# Patient Record
Sex: Male | Born: 1945 | Race: Black or African American | Hispanic: No | Marital: Married | Smoking: Never smoker
Health system: Southern US, Community
[De-identification: ages and names within clinical notes are randomized; demographics above are authoritative.]

## PROBLEM LIST (undated history)

## (undated) DIAGNOSIS — F40240 Claustrophobia: Secondary | ICD-10-CM

## (undated) DIAGNOSIS — N400 Enlarged prostate without lower urinary tract symptoms: Secondary | ICD-10-CM

## (undated) DIAGNOSIS — R269 Unspecified abnormalities of gait and mobility: Secondary | ICD-10-CM

## (undated) DIAGNOSIS — R413 Other amnesia: Secondary | ICD-10-CM

## (undated) DIAGNOSIS — F32A Depression, unspecified: Secondary | ICD-10-CM

## (undated) DIAGNOSIS — R471 Dysarthria and anarthria: Secondary | ICD-10-CM

## (undated) DIAGNOSIS — R399 Unspecified symptoms and signs involving the genitourinary system: Secondary | ICD-10-CM

## (undated) DIAGNOSIS — N529 Male erectile dysfunction, unspecified: Secondary | ICD-10-CM

## (undated) DIAGNOSIS — I10 Essential (primary) hypertension: Secondary | ICD-10-CM

## (undated) DIAGNOSIS — L723 Sebaceous cyst: Secondary | ICD-10-CM

## (undated) DIAGNOSIS — H409 Unspecified glaucoma: Secondary | ICD-10-CM

## (undated) DIAGNOSIS — E782 Mixed hyperlipidemia: Secondary | ICD-10-CM

## (undated) DIAGNOSIS — R3129 Other microscopic hematuria: Secondary | ICD-10-CM

## (undated) DIAGNOSIS — G238 Other specified degenerative diseases of basal ganglia: Secondary | ICD-10-CM

## (undated) DIAGNOSIS — F329 Major depressive disorder, single episode, unspecified: Secondary | ICD-10-CM

## (undated) DIAGNOSIS — Z973 Presence of spectacles and contact lenses: Secondary | ICD-10-CM

## (undated) DIAGNOSIS — J309 Allergic rhinitis, unspecified: Secondary | ICD-10-CM

## (undated) HISTORY — DX: Unspecified abnormalities of gait and mobility: R26.9

## (undated) HISTORY — DX: Other amnesia: R41.3

## (undated) HISTORY — DX: Essential (primary) hypertension: I10

## (undated) HISTORY — DX: Allergic rhinitis, unspecified: J30.9

## (undated) HISTORY — DX: Mixed hyperlipidemia: E78.2

## (undated) HISTORY — PX: NO PAST SURGERIES: SHX2092

## (undated) HISTORY — DX: Other specified degenerative diseases of basal ganglia: G23.8

## (undated) HISTORY — PX: OTHER SURGICAL HISTORY: SHX169

---

## 2000-06-29 ENCOUNTER — Ambulatory Visit (HOSPITAL_COMMUNITY): Admission: RE | Admit: 2000-06-29 | Discharge: 2000-06-29 | Payer: Self-pay | Admitting: *Deleted

## 2004-10-18 ENCOUNTER — Inpatient Hospital Stay (HOSPITAL_COMMUNITY): Admission: EM | Admit: 2004-10-18 | Discharge: 2004-10-20 | Payer: Self-pay | Admitting: Emergency Medicine

## 2004-10-19 ENCOUNTER — Ambulatory Visit: Payer: Self-pay | Admitting: Internal Medicine

## 2004-10-28 ENCOUNTER — Ambulatory Visit: Payer: Self-pay | Admitting: Cardiology

## 2005-08-04 ENCOUNTER — Ambulatory Visit (HOSPITAL_COMMUNITY): Admission: RE | Admit: 2005-08-04 | Discharge: 2005-08-04 | Payer: Self-pay | Admitting: *Deleted

## 2011-03-22 DIAGNOSIS — E7800 Pure hypercholesterolemia, unspecified: Secondary | ICD-10-CM | POA: Diagnosis not present

## 2011-03-22 DIAGNOSIS — E119 Type 2 diabetes mellitus without complications: Secondary | ICD-10-CM | POA: Diagnosis not present

## 2011-03-22 DIAGNOSIS — I10 Essential (primary) hypertension: Secondary | ICD-10-CM | POA: Diagnosis not present

## 2011-03-29 DIAGNOSIS — B000 Eczema herpeticum: Secondary | ICD-10-CM | POA: Diagnosis not present

## 2011-03-29 DIAGNOSIS — R269 Unspecified abnormalities of gait and mobility: Secondary | ICD-10-CM | POA: Diagnosis not present

## 2011-04-12 DIAGNOSIS — R269 Unspecified abnormalities of gait and mobility: Secondary | ICD-10-CM | POA: Diagnosis not present

## 2011-04-12 DIAGNOSIS — M629 Disorder of muscle, unspecified: Secondary | ICD-10-CM | POA: Diagnosis not present

## 2011-04-14 DIAGNOSIS — R269 Unspecified abnormalities of gait and mobility: Secondary | ICD-10-CM | POA: Diagnosis not present

## 2011-04-14 DIAGNOSIS — M629 Disorder of muscle, unspecified: Secondary | ICD-10-CM | POA: Diagnosis not present

## 2011-04-19 DIAGNOSIS — M629 Disorder of muscle, unspecified: Secondary | ICD-10-CM | POA: Diagnosis not present

## 2011-04-19 DIAGNOSIS — R269 Unspecified abnormalities of gait and mobility: Secondary | ICD-10-CM | POA: Diagnosis not present

## 2011-04-21 DIAGNOSIS — M629 Disorder of muscle, unspecified: Secondary | ICD-10-CM | POA: Diagnosis not present

## 2011-04-21 DIAGNOSIS — R269 Unspecified abnormalities of gait and mobility: Secondary | ICD-10-CM | POA: Diagnosis not present

## 2011-08-08 DIAGNOSIS — I10 Essential (primary) hypertension: Secondary | ICD-10-CM | POA: Diagnosis not present

## 2011-08-08 DIAGNOSIS — E782 Mixed hyperlipidemia: Secondary | ICD-10-CM | POA: Diagnosis not present

## 2011-08-08 DIAGNOSIS — R7309 Other abnormal glucose: Secondary | ICD-10-CM | POA: Diagnosis not present

## 2011-12-08 DIAGNOSIS — E782 Mixed hyperlipidemia: Secondary | ICD-10-CM | POA: Diagnosis not present

## 2011-12-08 DIAGNOSIS — R7309 Other abnormal glucose: Secondary | ICD-10-CM | POA: Diagnosis not present

## 2011-12-08 DIAGNOSIS — I10 Essential (primary) hypertension: Secondary | ICD-10-CM | POA: Diagnosis not present

## 2012-03-12 DIAGNOSIS — I10 Essential (primary) hypertension: Secondary | ICD-10-CM | POA: Diagnosis not present

## 2012-03-12 DIAGNOSIS — R7309 Other abnormal glucose: Secondary | ICD-10-CM | POA: Diagnosis not present

## 2012-03-12 DIAGNOSIS — E782 Mixed hyperlipidemia: Secondary | ICD-10-CM | POA: Diagnosis not present

## 2012-06-18 DIAGNOSIS — E782 Mixed hyperlipidemia: Secondary | ICD-10-CM | POA: Diagnosis not present

## 2012-06-18 DIAGNOSIS — I10 Essential (primary) hypertension: Secondary | ICD-10-CM | POA: Diagnosis not present

## 2012-06-18 DIAGNOSIS — R7309 Other abnormal glucose: Secondary | ICD-10-CM | POA: Diagnosis not present

## 2012-10-18 DIAGNOSIS — G238 Other specified degenerative diseases of basal ganglia: Secondary | ICD-10-CM | POA: Diagnosis not present

## 2012-10-18 DIAGNOSIS — R269 Unspecified abnormalities of gait and mobility: Secondary | ICD-10-CM | POA: Diagnosis not present

## 2012-10-18 DIAGNOSIS — I10 Essential (primary) hypertension: Secondary | ICD-10-CM | POA: Diagnosis not present

## 2012-10-22 DIAGNOSIS — I10 Essential (primary) hypertension: Secondary | ICD-10-CM | POA: Diagnosis not present

## 2012-10-22 DIAGNOSIS — R7309 Other abnormal glucose: Secondary | ICD-10-CM | POA: Diagnosis not present

## 2012-10-22 DIAGNOSIS — E782 Mixed hyperlipidemia: Secondary | ICD-10-CM | POA: Diagnosis not present

## 2012-11-01 DIAGNOSIS — M6281 Muscle weakness (generalized): Secondary | ICD-10-CM | POA: Diagnosis not present

## 2013-01-24 DIAGNOSIS — R7309 Other abnormal glucose: Secondary | ICD-10-CM | POA: Diagnosis not present

## 2013-01-24 DIAGNOSIS — E782 Mixed hyperlipidemia: Secondary | ICD-10-CM | POA: Diagnosis not present

## 2013-01-24 DIAGNOSIS — I10 Essential (primary) hypertension: Secondary | ICD-10-CM | POA: Diagnosis not present

## 2013-04-16 DIAGNOSIS — R059 Cough, unspecified: Secondary | ICD-10-CM | POA: Diagnosis not present

## 2013-04-16 DIAGNOSIS — R05 Cough: Secondary | ICD-10-CM | POA: Diagnosis not present

## 2013-04-16 DIAGNOSIS — R252 Cramp and spasm: Secondary | ICD-10-CM | POA: Diagnosis not present

## 2013-04-16 DIAGNOSIS — R0609 Other forms of dyspnea: Secondary | ICD-10-CM | POA: Diagnosis not present

## 2013-06-20 DIAGNOSIS — R7309 Other abnormal glucose: Secondary | ICD-10-CM | POA: Diagnosis not present

## 2013-06-20 DIAGNOSIS — E782 Mixed hyperlipidemia: Secondary | ICD-10-CM | POA: Diagnosis not present

## 2013-06-20 DIAGNOSIS — I10 Essential (primary) hypertension: Secondary | ICD-10-CM | POA: Diagnosis not present

## 2013-07-04 ENCOUNTER — Ambulatory Visit: Payer: Medicare Other | Admitting: Neurology

## 2013-07-05 ENCOUNTER — Encounter: Payer: Self-pay | Admitting: Neurology

## 2013-07-08 ENCOUNTER — Ambulatory Visit (INDEPENDENT_AMBULATORY_CARE_PROVIDER_SITE_OTHER): Payer: BC Managed Care – PPO | Admitting: Neurology

## 2013-07-08 ENCOUNTER — Encounter: Payer: Self-pay | Admitting: Neurology

## 2013-07-08 VITALS — BP 151/102 | HR 83 | Temp 97.0°F | Ht 70.0 in | Wt 200.0 lb

## 2013-07-08 DIAGNOSIS — F411 Generalized anxiety disorder: Secondary | ICD-10-CM

## 2013-07-08 DIAGNOSIS — G238 Other specified degenerative diseases of basal ganglia: Secondary | ICD-10-CM | POA: Diagnosis not present

## 2013-07-08 DIAGNOSIS — G903 Multi-system degeneration of the autonomic nervous system: Secondary | ICD-10-CM | POA: Insufficient documentation

## 2013-07-08 DIAGNOSIS — F329 Major depressive disorder, single episode, unspecified: Secondary | ICD-10-CM

## 2013-07-08 DIAGNOSIS — R1310 Dysphagia, unspecified: Secondary | ICD-10-CM

## 2013-07-08 DIAGNOSIS — R413 Other amnesia: Secondary | ICD-10-CM | POA: Diagnosis not present

## 2013-07-08 DIAGNOSIS — F32A Depression, unspecified: Secondary | ICD-10-CM

## 2013-07-08 DIAGNOSIS — F3289 Other specified depressive episodes: Secondary | ICD-10-CM | POA: Diagnosis not present

## 2013-07-08 DIAGNOSIS — R471 Dysarthria and anarthria: Secondary | ICD-10-CM

## 2013-07-08 NOTE — Patient Instructions (Addendum)
Please stop driving.   Please use your walker at all times for risk of falling.   Please talk to Dr. Renne CriglerPharr about your depression.   We will do a MRI brain and a swallow study. We will call you with the test results.

## 2013-07-08 NOTE — Progress Notes (Signed)
Subjective:    Patient ID: Donald Stokes is a 68 y.o. male.  HPI    Huston FoleySaima Atlee Villers, MD, PhD [facility] [address], [address] [address] [place], [place] [id number]  Dear Dr. Renne CriglerPharr,   I saw your patient, Donald Stokes, upon your kind request in my neurologic clinic today for initial consultation of his memory loss. The patient is accompanied by his wife today. As you know, Mr. 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 He was followed in our office , who has had some new memory loss. He reports forgetfulness for short term memory. His wife feels, that this referral is primarily for his underlying neurodegenerative condition. He has had some swallowing issues, but not on a daily basis. He walks with a rolling walker when outside, and has a scooter for longer distances, but inside the house he uses a cane, but mostly holds onto things. He was seen by Dr. Noreene FilbertSchmidt in 2003 and diagnosed with OPCA and saw Dr. 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 He had a brain MRI in 03/1999, which apparently showed prominent cerebellar and brainstem atrophy. He has not had a swallow study. He does not have a FHx of cerebellar ataxia. His son has MS.  He has fallen, but not in the last 1 1/2 months and thankfully has not hurt himself. He tends to trip and catches himself. He has sleep issues, including insomnia at night and EDS. He wakes with up with SOB, but does not have apneic pauses. In fact, [place], which per wife did not show any significant sleep disorder. He has control of his B/B function. He reports some depression and anxiety, but denies SI/HI, and has no hallucinations, no delusions, and no evidence of RBD. He sleeps  poorly and is restless. He sleeps best between 5 AM and 8 AM. He denies RLS Sx and there is no report of PLMs. He still drives.  His Past Medical History Is Significant For: Past Medical History  Diagnosis Date  . Memory loss   . Mixed hyperlipidemia   . Other abnormal glucose   . Other degenerative diseases of the basal ganglia   . Abnormality of gait   . Unspecified essential hypertension   . Other drug allergy   . Hypertrophy of prostate without urinary obstruction and other lower urinary tract symptoms (LUTS)   . Allergic rhinitis, cause unspecified   . Overweight   . 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  His Family History Is Significant For: Family History  Problem Relation Age of Onset  . Cancer Mother   . Heart failure Father   . Multiple sclerosis Son   . Stroke Maternal Grandmother   . Heart failure Maternal Uncle     His Social History Is Significant For: History   Social History  . Marital Status: Married    Spouse Name: N/A    Number of Children: N/A  . Years of Education: N/A   Social History Main Topics  . Smoking status: Never Smoker   . Smokeless tobacco: None  . Alcohol Use: No  . Drug Use: No  . Sexual Activity: None   Other Topics Concern  .  None   Social History Narrative  . None    His Allergies Are:  Allergies  Allergen Reactions  . Captopril     sexual impairment   . Corgard [Nadolol]     sexual impairment   . Cozaar [Losartan Potassium]     erectile disfunction  . Inderal La [Propranolol Hcl]     sexual impairment  . Minipress [Prazosin]     sexual impairment  . Reserpine     sexual impairment  . Tenormin [Atenolol]     sexual impairment  . Welchol [Colesevelam Hcl]     constipation  . Zocor [Simvastatin]     increased CK  :   His Current Medications Are:  Outpatient Encounter Prescriptions as of 07/08/2013  Medication Sig  .  amLODipine (NORVASC) 10 MG tablet Take 10 mg by mouth daily.  Marland Kitchen aspirin 81 MG tablet Take 81 mg by mouth daily.  Marland Kitchen ezetimibe (ZETIA) 10 MG tablet Take 10 mg by mouth daily.  . fenofibrate (TRICOR) 145 MG tablet Take 145 mg by mouth daily.  Marland Kitchen losartan-hydrochlorothiazide (HYZAAR) 50-12.5 MG per tablet Take 1 tablet by mouth daily.  . tamsulosin (FLOMAX) 0.4 MG CAPS capsule Take 0.4 mg by mouth daily.  : Review of Systems:  Out of a complete 14 point review of systems, all are reviewed and negative with the exception of these symptoms as listed below:   Review of Systems  Constitutional: Positive for activity change (disinterest).  HENT: Positive for hearing loss.   Eyes: Negative.   Respiratory: Positive for shortness of breath.   Cardiovascular: Negative.   Gastrointestinal:       Incontinence  Endocrine: Positive for polydipsia.  Genitourinary:       Impotence  Musculoskeletal:       Cramps  Skin: Negative.   Allergic/Immunologic: Positive for environmental allergies.  Neurological: Positive for speech difficulty and weakness.       Memory loss  Psychiatric/Behavioral: Positive for confusion, sleep disturbance (insomnia) and dysphoric mood. The patient is nervous/anxious.     Objective:  Neurologic Exam  Physical Exam Physical Examination:   Filed Vitals:   07/08/13 1008  BP: 151/102  Pulse: 83  Temp: 97 F (36.1 C)    General Examination: The patient is a very pleasant 68 y.o. male in no acute distress.  HEENT: Normocephalic, atraumatic, pupils are equal, round and reactive to light and accommodation. Funduscopic exam is normal with sharp disc margins noted. Extraocular tracking shows moderate saccadic breakdown without nystagmus noted. There is limitation to upper gaze. There is mild decrease in eye blink rate. Hearing is intact. Tympanic membranes are obscured by cerumen bilaterally. Face is symmetric with mild facial masking and normal facial sensation. There is no  lip, neck or jaw tremor. Neck is mildly rigid with intact passive ROM. There are no carotid bruits on auscultation. Oropharynx exam reveals moderate mouth dryness. No significant airway crowding is noted. Mallampati is class III. Tongue protrudes centrally and palate elevates symmetrically.   There is no drooling.   Chest: is clear to auscultation without wheezing, rhonchi or crackles noted.  Heart: sounds are regular and normal without murmurs, rubs or gallops noted.   Abdomen: is soft, non-tender and non-distended with normal bowel sounds appreciated on auscultation.  Extremities: There is no pitting edema in the distal lower extremities bilaterally. Pedal pulses are intact.  Skin: is warm and dry with no trophic changes noted. Age-related changes are noted on the skin.   Musculoskeletal: exam reveals  no obvious joint deformities, tenderness, joint swelling or erythema.  Neurologically:  Mental status: The patient is awake and alert, paying good  attention. He is able to partially provide the history. His wife provides details. He is oriented to: person, place, time/date, situation, day of week, month of year and year. His memory, attention, language and knowledge are mildly impaired. There is no aphasia, agnosia, apraxia or anomia. There is a mild degree of bradyphrenia. Speech is mildly hypophonic with severe dysarthria noted and mild to moderate degree of scanning. Mood is congruent and affect is blunted.  His MMSE score is 26/30. CDT is 3/4. AFT (Animal Fluency Test) score is 9.   Cranial nerves are as described above under HEENT exam. In addition, shoulder shrug is normal with equal shoulder height noted.  Motor exam: Normal bulk, and strength for age is noted. There are no dyskinesias noted.  Tone is not rigid with absence of cogwheeling in the extremities. There is overall moderate bradykinesia. There is no drift, but he has evidence of rebound bilaterally.  There is no tremor.    Romberg is positive.  Reflexes are 1+ in the upper extremities and trace in the lower extremities. Toes are downgoing bilaterally.  Fine motor skills exam: Finger taps are moderately impaired on the right and moderately impaired on the left. Hand movements are moderately impaired on the right and moderately impaired on the left. RAP (rapid alternating patting) is moderately impaired on the right and moderately impaired on the left. Foot taps are moderately impaired on the right and moderately impaired on the left. Foot agility (in the form of heel stomping) is moderately impaired on the right and moderately impaired on the left.    Cerebellar testing shows moderate dysmetria a mild intention tremor on finger to nose testing. Heel to shin is moderately impaired bilaterally. There is very mild truncal ataxia.   Sensory exam is intact to light touch, pinprick, vibration, temperature sense in the upper and lower extremities.   Gait, station and balance: He stands up from the seated position with mild difficulty and does need to push up with His hands. He needs no assistance. No veering to one side is noted. He is not noted to lean to the side. Posture is mildly stooped for age. Stance is wide-based. He walks with his rolling walker with an ataxic, wide-base gait and difficulty with turns. His upper body turns faster than his lower body and he therefore turns the walker faster than his legs or feet can catch up with. Tandem walk is not possible. Balance is moderately impaired. He is not able to do a toe or heel stance.     Assessment and Plan:   In summary, Donald Stokes is a very pleasant 68 y.o.-year old male with a history of a neurodegenerative condition, most likely in keeping with MSA-C, per prior Hx and exam. He has evidence of mild depression and anxiety and is encouraged to discuss treatment of depression with you. He has evidence of mild memory impairment, most likely in the realm of mild  cognitive impairment. He reports swallowing issues. He had a sleep study which apparently did not show any evidence of RBD or sleep disordered breathing. I had a long discussion with the patient and his wife today and would like to proceed with a brain MRI without contrast, a swallow study and referral to physical therapy for gait evaluation and evaluation of his walker. I have also placed a referral for home health occupational  therapy to see if he has any equipment needs were adjustments to be done at home. He is advised to no longer drive. He is advised to use his walker at all times. I explained to the patient and his wife that his condition is invariably progressive and it is hard to say how quickly it will progress. Most likely he will see worsening of his balance and his walking and speech and swallowing difficulties. His memory make it worse as well. At this juncture, I did not suggest any new medications from my end of things and we should be able to call him with his test results. If there are any equipment needs I would be happy to write prescriptions for those.  I answered all their questions today and the patient and his wife were in agreement with the above outlined plan. I would like to see the patient back in 6 months, sooner if the need arises and encouraged them to call with any interim questions, concerns, problems or updates.   Thank you very much for allowing me to participate in the care of this nice patient. If I can be of any further assistance to you please do not hesitate to call me at [phone].  Sincerely,   Huston Foley, MD, PhD

## 2013-07-10 DIAGNOSIS — F411 Generalized anxiety disorder: Secondary | ICD-10-CM | POA: Diagnosis not present

## 2013-07-10 DIAGNOSIS — Z9181 History of falling: Secondary | ICD-10-CM | POA: Diagnosis not present

## 2013-07-10 DIAGNOSIS — F329 Major depressive disorder, single episode, unspecified: Secondary | ICD-10-CM | POA: Diagnosis not present

## 2013-07-10 DIAGNOSIS — I10 Essential (primary) hypertension: Secondary | ICD-10-CM | POA: Diagnosis not present

## 2013-07-10 DIAGNOSIS — G259 Extrapyramidal and movement disorder, unspecified: Secondary | ICD-10-CM | POA: Diagnosis not present

## 2013-07-10 DIAGNOSIS — R279 Unspecified lack of coordination: Secondary | ICD-10-CM | POA: Diagnosis not present

## 2013-07-10 DIAGNOSIS — F3289 Other specified depressive episodes: Secondary | ICD-10-CM | POA: Diagnosis not present

## 2013-07-15 DIAGNOSIS — F329 Major depressive disorder, single episode, unspecified: Secondary | ICD-10-CM | POA: Diagnosis not present

## 2013-07-15 DIAGNOSIS — F3289 Other specified depressive episodes: Secondary | ICD-10-CM | POA: Diagnosis not present

## 2013-07-15 DIAGNOSIS — R279 Unspecified lack of coordination: Secondary | ICD-10-CM | POA: Diagnosis not present

## 2013-07-15 DIAGNOSIS — I10 Essential (primary) hypertension: Secondary | ICD-10-CM | POA: Diagnosis not present

## 2013-07-15 DIAGNOSIS — Z9181 History of falling: Secondary | ICD-10-CM | POA: Diagnosis not present

## 2013-07-15 DIAGNOSIS — F411 Generalized anxiety disorder: Secondary | ICD-10-CM | POA: Diagnosis not present

## 2013-07-15 DIAGNOSIS — G259 Extrapyramidal and movement disorder, unspecified: Secondary | ICD-10-CM | POA: Diagnosis not present

## 2013-07-17 DIAGNOSIS — F3289 Other specified depressive episodes: Secondary | ICD-10-CM | POA: Diagnosis not present

## 2013-07-17 DIAGNOSIS — R279 Unspecified lack of coordination: Secondary | ICD-10-CM | POA: Diagnosis not present

## 2013-07-17 DIAGNOSIS — F329 Major depressive disorder, single episode, unspecified: Secondary | ICD-10-CM | POA: Diagnosis not present

## 2013-07-17 DIAGNOSIS — Z9181 History of falling: Secondary | ICD-10-CM | POA: Diagnosis not present

## 2013-07-17 DIAGNOSIS — G259 Extrapyramidal and movement disorder, unspecified: Secondary | ICD-10-CM | POA: Diagnosis not present

## 2013-07-17 DIAGNOSIS — F411 Generalized anxiety disorder: Secondary | ICD-10-CM | POA: Diagnosis not present

## 2013-07-17 DIAGNOSIS — I10 Essential (primary) hypertension: Secondary | ICD-10-CM | POA: Diagnosis not present

## 2013-07-18 DIAGNOSIS — I10 Essential (primary) hypertension: Secondary | ICD-10-CM | POA: Diagnosis not present

## 2013-07-18 DIAGNOSIS — Z9181 History of falling: Secondary | ICD-10-CM | POA: Diagnosis not present

## 2013-07-18 DIAGNOSIS — F411 Generalized anxiety disorder: Secondary | ICD-10-CM | POA: Diagnosis not present

## 2013-07-18 DIAGNOSIS — F3289 Other specified depressive episodes: Secondary | ICD-10-CM | POA: Diagnosis not present

## 2013-07-18 DIAGNOSIS — F329 Major depressive disorder, single episode, unspecified: Secondary | ICD-10-CM | POA: Diagnosis not present

## 2013-07-18 DIAGNOSIS — R279 Unspecified lack of coordination: Secondary | ICD-10-CM | POA: Diagnosis not present

## 2013-07-18 DIAGNOSIS — G259 Extrapyramidal and movement disorder, unspecified: Secondary | ICD-10-CM | POA: Diagnosis not present

## 2013-07-19 DIAGNOSIS — G259 Extrapyramidal and movement disorder, unspecified: Secondary | ICD-10-CM | POA: Diagnosis not present

## 2013-07-19 DIAGNOSIS — Z9181 History of falling: Secondary | ICD-10-CM | POA: Diagnosis not present

## 2013-07-19 DIAGNOSIS — F411 Generalized anxiety disorder: Secondary | ICD-10-CM | POA: Diagnosis not present

## 2013-07-19 DIAGNOSIS — F3289 Other specified depressive episodes: Secondary | ICD-10-CM | POA: Diagnosis not present

## 2013-07-19 DIAGNOSIS — I10 Essential (primary) hypertension: Secondary | ICD-10-CM | POA: Diagnosis not present

## 2013-07-19 DIAGNOSIS — F329 Major depressive disorder, single episode, unspecified: Secondary | ICD-10-CM | POA: Diagnosis not present

## 2013-07-19 DIAGNOSIS — R279 Unspecified lack of coordination: Secondary | ICD-10-CM | POA: Diagnosis not present

## 2013-07-22 DIAGNOSIS — G259 Extrapyramidal and movement disorder, unspecified: Secondary | ICD-10-CM | POA: Diagnosis not present

## 2013-07-22 DIAGNOSIS — F3289 Other specified depressive episodes: Secondary | ICD-10-CM | POA: Diagnosis not present

## 2013-07-22 DIAGNOSIS — I10 Essential (primary) hypertension: Secondary | ICD-10-CM | POA: Diagnosis not present

## 2013-07-22 DIAGNOSIS — Z9181 History of falling: Secondary | ICD-10-CM | POA: Diagnosis not present

## 2013-07-22 DIAGNOSIS — R279 Unspecified lack of coordination: Secondary | ICD-10-CM | POA: Diagnosis not present

## 2013-07-22 DIAGNOSIS — F329 Major depressive disorder, single episode, unspecified: Secondary | ICD-10-CM | POA: Diagnosis not present

## 2013-07-22 DIAGNOSIS — F411 Generalized anxiety disorder: Secondary | ICD-10-CM | POA: Diagnosis not present

## 2013-07-24 DIAGNOSIS — F329 Major depressive disorder, single episode, unspecified: Secondary | ICD-10-CM | POA: Diagnosis not present

## 2013-07-24 DIAGNOSIS — F3289 Other specified depressive episodes: Secondary | ICD-10-CM | POA: Diagnosis not present

## 2013-07-24 DIAGNOSIS — F411 Generalized anxiety disorder: Secondary | ICD-10-CM | POA: Diagnosis not present

## 2013-07-24 DIAGNOSIS — G259 Extrapyramidal and movement disorder, unspecified: Secondary | ICD-10-CM | POA: Diagnosis not present

## 2013-07-24 DIAGNOSIS — Z9181 History of falling: Secondary | ICD-10-CM | POA: Diagnosis not present

## 2013-07-24 DIAGNOSIS — I10 Essential (primary) hypertension: Secondary | ICD-10-CM | POA: Diagnosis not present

## 2013-07-24 DIAGNOSIS — R279 Unspecified lack of coordination: Secondary | ICD-10-CM | POA: Diagnosis not present

## 2013-07-25 DIAGNOSIS — R279 Unspecified lack of coordination: Secondary | ICD-10-CM | POA: Diagnosis not present

## 2013-07-25 DIAGNOSIS — I10 Essential (primary) hypertension: Secondary | ICD-10-CM | POA: Diagnosis not present

## 2013-07-25 DIAGNOSIS — Z9181 History of falling: Secondary | ICD-10-CM | POA: Diagnosis not present

## 2013-07-25 DIAGNOSIS — F411 Generalized anxiety disorder: Secondary | ICD-10-CM | POA: Diagnosis not present

## 2013-07-25 DIAGNOSIS — G259 Extrapyramidal and movement disorder, unspecified: Secondary | ICD-10-CM | POA: Diagnosis not present

## 2013-07-25 DIAGNOSIS — F329 Major depressive disorder, single episode, unspecified: Secondary | ICD-10-CM | POA: Diagnosis not present

## 2013-07-25 DIAGNOSIS — F3289 Other specified depressive episodes: Secondary | ICD-10-CM | POA: Diagnosis not present

## 2013-07-26 ENCOUNTER — Ambulatory Visit (HOSPITAL_COMMUNITY)
Admission: RE | Admit: 2013-07-26 | Discharge: 2013-07-26 | Disposition: A | Discharge status: Home / Self Care | Payer: BC Managed Care – PPO | Source: Ambulatory Visit | Attending: Neurology | Admitting: Neurology

## 2013-07-26 DIAGNOSIS — R413 Other amnesia: Secondary | ICD-10-CM | POA: Diagnosis not present

## 2013-07-26 DIAGNOSIS — F411 Generalized anxiety disorder: Secondary | ICD-10-CM | POA: Diagnosis not present

## 2013-07-26 DIAGNOSIS — R1310 Dysphagia, unspecified: Secondary | ICD-10-CM | POA: Insufficient documentation

## 2013-07-26 DIAGNOSIS — F32A Depression, unspecified: Secondary | ICD-10-CM

## 2013-07-26 DIAGNOSIS — R471 Dysarthria and anarthria: Secondary | ICD-10-CM

## 2013-07-26 DIAGNOSIS — N400 Enlarged prostate without lower urinary tract symptoms: Secondary | ICD-10-CM | POA: Insufficient documentation

## 2013-07-26 DIAGNOSIS — G238 Other specified degenerative diseases of basal ganglia: Secondary | ICD-10-CM | POA: Diagnosis not present

## 2013-07-26 DIAGNOSIS — F329 Major depressive disorder, single episode, unspecified: Secondary | ICD-10-CM | POA: Diagnosis not present

## 2013-07-26 DIAGNOSIS — F3289 Other specified depressive episodes: Secondary | ICD-10-CM | POA: Diagnosis not present

## 2013-07-26 NOTE — Procedures (Signed)
Objective Swallowing Evaluation: Modified Barium Swallowing Study  Patient Details  Name: Donald Stokes MRN: [id number] Date of Birth: [birth year]  Today's Date: 07/26/2013 Time: [id number] SLP Time Calculation (min): 30 min  Past Medical History:  Past Medical History  Diagnosis Date  . Memory loss   . Mixed hyperlipidemia   . Other abnormal glucose   . Other degenerative diseases of the basal ganglia   . Abnormality of gait   . Unspecified essential hypertension   . Other drug allergy   . Hypertrophy of prostate without urinary obstruction and other lower urinary tract symptoms (LUTS)   . Allergic rhinitis, cause unspecified   . Overweight   . 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 HPI:  68 yo male referred by Dr Frances FurbishAthar for MBS due to pt report of coughing/choking with food and even on secretions.  Pt PMH + for memory loss, HLD, degenerative dx of basal ganglia, gait abnormality, allergic rhinitis, HTN, multiple system atrophy C.  Pt reports taking medications with water with good tolerance.  He coughs more on food than drinks x1 year and more often at lunch/dinner.  Pt admits to coughing when lying down in his bed at night but not during the day when he sleeps in the recliner.  He has not lost weight nor had pulmonary infections.       Assessment / Plan / Recommendation Clinical Impression  Dysphagia Diagnosis: Within Functional Limits  Clinical impression: [facility] with timely oral transiting and pharyngeal swallow and adequate strength without significant stasis.  Pt observed with thin, nectar, cracker, pudding and barium tablet.  Trace vallecular residuals with thin noted that cleared with cued dry swallow.  Pt did not cough/choke during MBS procedure.     Advised pt and spouse to general aspiration precautions given his report of choking on food 2-3 times each week.  Providing precautions/compensations in writing  given pt's medical diagnosis (MSA).    Pt reports frequent coughing when lying down at night  - but not when resting in recliner at night.  Advised him to speak to his Md re: this issue.   Recommend pt make concerted effort to swallow his saliva as question if pt with mild secretion management difficulties (secretion stasis at test initiation).      Treatment Recommendation  No treatment recommended at this time    Diet Recommendation Regular;Thin liquid   Liquid Administration via: Cup;Straw Medication Administration: Whole meds with liquid Supervision: Patient able to self feed Compensations: Slow rate;Small sips/bites Postural Changes and/or Swallow Maneuvers: Seated upright 90 degrees;Upright 30-60 min after meal      General Date of Onset: 07/26/13 HPI: 68 yo male referred by Dr Frances FurbishAthar for MBS due to pt report of coughing/choking with food and even on secretions.  Pt PMH + for memory loss, HLD, degenerative dx of basal ganglia, gait abnormality, allergic rhinitis, HTN, multiple system atrophy C.  Pt reports taking medications with water with good tolerance.  He coughs more on food than drinks x1 year and more often at lunch/dinner.  Pt admits to coughing when lying down in his bed at night but not during the day when he sleeps in the recliner.  He has not lost weight nor had pulmonary infections.   Type of Study: Modified Barium Swallowing Study Reason for Referral: Objectively evaluate swallowing function Diet Prior to this Study: Regular;Thin liquids Temperature Spikes Noted: No Respiratory Status: Room air Behavior/Cognition:  Alert;Cooperative;Pleasant mood Oral Cavity - Dentition: Adequate natural dentition Oral Motor / Sensory Function:  (facial ? mild asymmetry, equal sensation, tongue midline but weak, standing secretions in mouth, masked facies, ? slight twitching of left upper lip) Self-Feeding Abilities: Able to feed self Patient Positioning: Upright in chair Baseline Vocal  Quality: Clear Volitional Cough: Strong Volitional Swallow: Able to elicit Anatomy: Within functional limits Pharyngeal Secretions: Standing secretions in (comment) (oral cavity cleared)    Reason for Referral Objectively evaluate swallowing function   Oral Phase Oral Preparation/Oral Phase Oral Phase: WFL   Pharyngeal Phase Pharyngeal Phase Pharyngeal Phase: Within functional limits  Cervical Esophageal Phase    GO    Cervical Esophageal Phase Cervical Esophageal Phase: [facility]    Functional Assessment Tool Used: mbs. clinical impression Functional Limitations: Swallowing Swallow Current Status (O1561): At least 1 percent but less than 20 percent impaired, limited or restricted Swallow Goal Status [phone]): At least 1 percent but less than 20 percent impaired, limited or restricted Swallow Discharge Status [phone]): At least 1 percent but less than 20 percent impaired, limited or restricted    Donavan Burnet, MS [facility] SLP [phone]

## 2013-07-29 DIAGNOSIS — F329 Major depressive disorder, single episode, unspecified: Secondary | ICD-10-CM | POA: Diagnosis not present

## 2013-07-29 DIAGNOSIS — G259 Extrapyramidal and movement disorder, unspecified: Secondary | ICD-10-CM | POA: Diagnosis not present

## 2013-07-29 DIAGNOSIS — Z9181 History of falling: Secondary | ICD-10-CM | POA: Diagnosis not present

## 2013-07-29 DIAGNOSIS — F3289 Other specified depressive episodes: Secondary | ICD-10-CM | POA: Diagnosis not present

## 2013-07-29 DIAGNOSIS — I10 Essential (primary) hypertension: Secondary | ICD-10-CM | POA: Diagnosis not present

## 2013-07-29 DIAGNOSIS — F411 Generalized anxiety disorder: Secondary | ICD-10-CM | POA: Diagnosis not present

## 2013-07-29 DIAGNOSIS — R279 Unspecified lack of coordination: Secondary | ICD-10-CM | POA: Diagnosis not present

## 2013-07-30 ENCOUNTER — Ambulatory Visit
Admission: RE | Admit: 2013-07-30 | Discharge: 2013-07-30 | Disposition: A | Discharge status: Home / Self Care | Payer: BC Managed Care – PPO | Source: Ambulatory Visit | Attending: Neurology | Admitting: Neurology

## 2013-07-30 DIAGNOSIS — F329 Major depressive disorder, single episode, unspecified: Secondary | ICD-10-CM

## 2013-07-30 DIAGNOSIS — F411 Generalized anxiety disorder: Secondary | ICD-10-CM

## 2013-07-30 DIAGNOSIS — G238 Other specified degenerative diseases of basal ganglia: Secondary | ICD-10-CM

## 2013-07-30 DIAGNOSIS — F32A Depression, unspecified: Secondary | ICD-10-CM

## 2013-07-30 DIAGNOSIS — R413 Other amnesia: Secondary | ICD-10-CM

## 2013-07-31 ENCOUNTER — Telehealth: Payer: Self-pay | Admitting: Neurology

## 2013-07-31 DIAGNOSIS — R279 Unspecified lack of coordination: Secondary | ICD-10-CM | POA: Diagnosis not present

## 2013-07-31 DIAGNOSIS — F329 Major depressive disorder, single episode, unspecified: Secondary | ICD-10-CM | POA: Diagnosis not present

## 2013-07-31 DIAGNOSIS — Z9181 History of falling: Secondary | ICD-10-CM | POA: Diagnosis not present

## 2013-07-31 DIAGNOSIS — H7090 Unspecified mastoiditis, unspecified ear: Secondary | ICD-10-CM

## 2013-07-31 DIAGNOSIS — F411 Generalized anxiety disorder: Secondary | ICD-10-CM | POA: Diagnosis not present

## 2013-07-31 DIAGNOSIS — G259 Extrapyramidal and movement disorder, unspecified: Secondary | ICD-10-CM | POA: Diagnosis not present

## 2013-07-31 DIAGNOSIS — I10 Essential (primary) hypertension: Secondary | ICD-10-CM | POA: Diagnosis not present

## 2013-07-31 DIAGNOSIS — F3289 Other specified depressive episodes: Secondary | ICD-10-CM | POA: Diagnosis not present

## 2013-07-31 NOTE — Telephone Encounter (Signed)
Please call patient regarding his recent brain MRI: It confirms severe atrophy of his brain stem and cerebellum, findings that were seen in the past as well. There is evidence of sinus disease and because of that I would like for him to see an ear nose throat physician and I placed a referral.

## 2013-08-02 NOTE — Telephone Encounter (Signed)
Called patient. Went over Dr. Teofilo Pod previous note. The patient verbalized understanding.

## 2013-08-05 ENCOUNTER — Telehealth: Payer: Self-pay | Admitting: *Deleted

## 2013-08-05 NOTE — Telephone Encounter (Signed)
Informed patient of appointment information with Dr. Jearld Fenton on June 9 th at 2 pm, patient did really understand why he was being referred to this doctor and stated that he had to see his PCP first, I informed patient that he was referred there by Dr. Frances Furbish due to his MRI results, patient was given the telephone number where he can call and r/s his appointment with Dr. Jearld Fenton.

## 2013-08-06 DIAGNOSIS — I10 Essential (primary) hypertension: Secondary | ICD-10-CM | POA: Diagnosis not present

## 2013-08-06 DIAGNOSIS — Z9181 History of falling: Secondary | ICD-10-CM | POA: Diagnosis not present

## 2013-08-06 DIAGNOSIS — F329 Major depressive disorder, single episode, unspecified: Secondary | ICD-10-CM | POA: Diagnosis not present

## 2013-08-06 DIAGNOSIS — F3289 Other specified depressive episodes: Secondary | ICD-10-CM | POA: Diagnosis not present

## 2013-08-06 DIAGNOSIS — G259 Extrapyramidal and movement disorder, unspecified: Secondary | ICD-10-CM | POA: Diagnosis not present

## 2013-08-06 DIAGNOSIS — F411 Generalized anxiety disorder: Secondary | ICD-10-CM | POA: Diagnosis not present

## 2013-08-06 DIAGNOSIS — R279 Unspecified lack of coordination: Secondary | ICD-10-CM | POA: Diagnosis not present

## 2013-08-08 DIAGNOSIS — F3289 Other specified depressive episodes: Secondary | ICD-10-CM | POA: Diagnosis not present

## 2013-08-08 DIAGNOSIS — R279 Unspecified lack of coordination: Secondary | ICD-10-CM | POA: Diagnosis not present

## 2013-08-08 DIAGNOSIS — F411 Generalized anxiety disorder: Secondary | ICD-10-CM | POA: Diagnosis not present

## 2013-08-08 DIAGNOSIS — G259 Extrapyramidal and movement disorder, unspecified: Secondary | ICD-10-CM | POA: Diagnosis not present

## 2013-08-08 DIAGNOSIS — I10 Essential (primary) hypertension: Secondary | ICD-10-CM | POA: Diagnosis not present

## 2013-08-08 DIAGNOSIS — F329 Major depressive disorder, single episode, unspecified: Secondary | ICD-10-CM | POA: Diagnosis not present

## 2013-08-08 DIAGNOSIS — Z9181 History of falling: Secondary | ICD-10-CM | POA: Diagnosis not present

## 2013-08-13 DIAGNOSIS — I10 Essential (primary) hypertension: Secondary | ICD-10-CM | POA: Diagnosis not present

## 2013-08-13 DIAGNOSIS — Z9181 History of falling: Secondary | ICD-10-CM | POA: Diagnosis not present

## 2013-08-13 DIAGNOSIS — F329 Major depressive disorder, single episode, unspecified: Secondary | ICD-10-CM | POA: Diagnosis not present

## 2013-08-13 DIAGNOSIS — R279 Unspecified lack of coordination: Secondary | ICD-10-CM | POA: Diagnosis not present

## 2013-08-13 DIAGNOSIS — G259 Extrapyramidal and movement disorder, unspecified: Secondary | ICD-10-CM | POA: Diagnosis not present

## 2013-08-13 DIAGNOSIS — F411 Generalized anxiety disorder: Secondary | ICD-10-CM | POA: Diagnosis not present

## 2013-08-13 DIAGNOSIS — F3289 Other specified depressive episodes: Secondary | ICD-10-CM | POA: Diagnosis not present

## 2013-08-21 DIAGNOSIS — R279 Unspecified lack of coordination: Secondary | ICD-10-CM | POA: Diagnosis not present

## 2013-08-21 DIAGNOSIS — I10 Essential (primary) hypertension: Secondary | ICD-10-CM | POA: Diagnosis not present

## 2013-08-21 DIAGNOSIS — F3289 Other specified depressive episodes: Secondary | ICD-10-CM | POA: Diagnosis not present

## 2013-08-21 DIAGNOSIS — G259 Extrapyramidal and movement disorder, unspecified: Secondary | ICD-10-CM | POA: Diagnosis not present

## 2013-08-21 DIAGNOSIS — Z9181 History of falling: Secondary | ICD-10-CM | POA: Diagnosis not present

## 2013-08-21 DIAGNOSIS — F329 Major depressive disorder, single episode, unspecified: Secondary | ICD-10-CM | POA: Diagnosis not present

## 2013-08-21 DIAGNOSIS — F411 Generalized anxiety disorder: Secondary | ICD-10-CM | POA: Diagnosis not present

## 2013-09-18 ENCOUNTER — Telehealth: Payer: Self-pay | Admitting: Neurology

## 2013-09-18 NOTE — Telephone Encounter (Signed)
Victorino DikeJennifer with [facility] @ [phone] checking the status of plan of care faxed over on 6/25.  Please call and advise.  Thanks

## 2013-09-19 DIAGNOSIS — I10 Essential (primary) hypertension: Secondary | ICD-10-CM | POA: Diagnosis not present

## 2013-09-19 DIAGNOSIS — R7309 Other abnormal glucose: Secondary | ICD-10-CM | POA: Diagnosis not present

## 2013-09-19 DIAGNOSIS — E782 Mixed hyperlipidemia: Secondary | ICD-10-CM | POA: Diagnosis not present

## 2013-09-25 NOTE — Telephone Encounter (Signed)
[facility] and left message informing her that we need for her to refax the plan of care over to our office again and if any other questions or concerns to call the office.

## 2013-10-02 DIAGNOSIS — J329 Chronic sinusitis, unspecified: Secondary | ICD-10-CM | POA: Diagnosis not present

## 2013-10-02 DIAGNOSIS — H6520 Chronic serous otitis media, unspecified ear: Secondary | ICD-10-CM | POA: Diagnosis not present

## 2013-10-10 NOTE — Telephone Encounter (Signed)
Noted  

## 2014-01-08 ENCOUNTER — Ambulatory Visit (INDEPENDENT_AMBULATORY_CARE_PROVIDER_SITE_OTHER): Payer: BC Managed Care – PPO | Admitting: Neurology

## 2014-01-08 ENCOUNTER — Encounter: Payer: Self-pay | Admitting: Neurology

## 2014-01-08 VITALS — BP 134/84 | HR 80 | Temp 97.2°F | Ht 70.0 in | Wt 204.0 lb

## 2014-01-08 DIAGNOSIS — F32A Depression, unspecified: Secondary | ICD-10-CM

## 2014-01-08 DIAGNOSIS — N528 Other male erectile dysfunction: Secondary | ICD-10-CM

## 2014-01-08 DIAGNOSIS — H6123 Impacted cerumen, bilateral: Secondary | ICD-10-CM

## 2014-01-08 DIAGNOSIS — F329 Major depressive disorder, single episode, unspecified: Secondary | ICD-10-CM | POA: Diagnosis not present

## 2014-01-08 DIAGNOSIS — G238 Other specified degenerative diseases of basal ganglia: Secondary | ICD-10-CM

## 2014-01-08 DIAGNOSIS — R351 Nocturia: Secondary | ICD-10-CM | POA: Diagnosis not present

## 2014-01-08 DIAGNOSIS — R413 Other amnesia: Secondary | ICD-10-CM

## 2014-01-08 NOTE — Progress Notes (Signed)
Subjective:    Patient ID: Donald Stokes is a 68 y.o. male.  HPI     Interim history:   Mr. Donald Stokes is a very pleasant 68 year old right-handed gentleman with an underlying medical history of hypertension, hyperlipidemia, BPH, allergic rhinitis, obesity and chronic balance problems in the context of a Dx of MSA, who presents for follow-up consultation of his memory loss. He is accompanied by his wife again today. I first met him on 07/08/2013 at the request of his primary care physician, at which time the patient reported forgetfulness and short-term memory issues. He also reported some swallowing issues. He was using a rolling walker and also had a scooter for longer distances and inside the house he was using a cane or was holding onto things. I suggested referral to home health occupational therapy. He was advised to no longer drive. I requested a brain MRI without contrast. He had this on 07/30/2013: Abnormal MRI brain (without) demonstrating: 1. Severe pontine and cerebellar atrophy. 2. Few punctate foci of periventricular and subcortical foci of non-specific gliosis. 3. Severe fluid/inflammation in the right mastoid air cells. Non-specific mild fluid and mucosal thickening in the sphenoid, ethmoid and maxillary sinuses. I also ordered a swallow study. He had this on 07/26/2013: This did not show any evidence of aspiration and he did not have any coughing or choking during the procedure. He was not advised to change his food or liquid consistency. General recommendations were given regarding aspiration precautions.  Today, he reports having had a sleep study in May in [facility] and they verbally were told that there was no sleep apnea. He saw an ENT and was treated with ABx and had an ear tube placed. He was seen by Lilyan Gilford at Dr. Pennie Banter office yesterday, but he did not address his depression and his wife was not there for the appointment. Recent blood work was good. He has more nocturia  now. His balance is worse and he has ED. His amlodipine is now generic. He denies being depressed but his wife feels he is depressed.  He was seen by Dr. 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 He had a brain MRI in 03/1999, which apparently showed prominent cerebellar and brainstem atrophy. He has not had a swallow study. He does not have a FHx of cerebellar ataxia. His son has MS.   He has fallen, but not in the last 1 1/2 months and thankfully has not hurt himself. He tends to trip and catches himself. He has sleep issues, including insomnia at night and EDS. He wakes with up with SOB, but does not have apneic pauses. In fact, he had a sleep study in [place] some 2 weeks ago, which per wife did not show any significant sleep disorder. He has control of his B/B function. He reports some depression and anxiety, but denies SI/HI, and has no hallucinations, no delusions, and no evidence of RBD. He sleeps poorly and is restless. He sleeps best between 5 AM and 8 AM. He denies RLS Sx and there is no report of PLMs. He still drives.  His Past Medical History Is Significant For: Past Medical History  Diagnosis Date  . Memory loss   . Mixed hyperlipidemia   . Other abnormal glucose   . Other degenerative diseases of the basal ganglia   . Abnormality of gait   . Unspecified essential hypertension   . Other drug allergy(995.27)   .  Hypertrophy of prostate without urinary obstruction and other lower urinary tract symptoms (LUTS)   . Allergic rhinitis, cause unspecified   . Overweight(278.02)   . 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 No pertinent past surgical history.  His Family History Is Significant For: Family History  Problem Relation Age of Onset  . Cancer Mother   . Heart failure Father   . Multiple sclerosis Son   . Stroke Maternal Grandmother   . Heart  failure Maternal Uncle     His Social History Is Significant For: History   Social History  . Marital Status: Married    Spouse Name: Donald Stokes    Number of Children: 4  . Years of Education: 12   Occupational History  .      retired   Social History Main Topics  . Smoking status: Never Smoker   . Smokeless tobacco: Never Used  . Alcohol Use: No  . Drug Use: No  . Sexual Activity: None   Other Topics Concern  . None   Social History Narrative   Patient is right handed and consumes no caffeine    His Allergies Are:  Allergies  Allergen Reactions  . Captopril     sexual impairment   . Corgard [Nadolol]     sexual impairment   . Cozaar [Losartan Potassium]     erectile disfunction  . Inderal La [Propranolol Hcl]     sexual impairment  . Minipress [Prazosin]     sexual impairment  . Reserpine     sexual impairment  . Tenormin [Atenolol]     sexual impairment  . Welchol [Colesevelam Hcl]     constipation  . Zocor [Simvastatin]     increased CK  :   His Current Medications Are:  Outpatient Encounter Prescriptions as of 01/08/2014  Medication Sig  . amLODipine (NORVASC) 10 MG tablet Take 10 mg by mouth daily.  Marland Kitchen aspirin 81 MG tablet Take 81 mg by mouth daily.  Marland Kitchen ezetimibe (ZETIA) 10 MG tablet Take 10 mg by mouth daily.  . fenofibrate (TRICOR) 145 MG tablet Take 145 mg by mouth daily.  Marland Kitchen losartan-hydrochlorothiazide (HYZAAR) 50-12.5 MG per tablet Take 1 tablet by mouth daily.  . tamsulosin (FLOMAX) 0.4 MG CAPS capsule Take 0.4 mg by mouth daily.  :  Review of Systems:  Out of a complete 14 point review of systems, all are reviewed and negative with the exception of these symptoms as listed below:   Review of Systems  Respiratory: Positive for choking.   Genitourinary:       Frequency of urination  Musculoskeletal: Positive for gait problem.       Muscle cramps  Neurological:       Daytime sleepiness    Objective:  Neurologic Exam  Physical  Exam Physical Examination:   Filed Vitals:   01/08/14 1136  BP: 134/84  Pulse: 80  Temp: 97.2 F (36.2 C)   General Examination: The patient is a very pleasant 68 y.o. male in no acute distress.  HEENT: Normocephalic, atraumatic, pupils are equal, round and reactive to light and accommodation. Funduscopic exam is normal with sharp disc margins noted. Extraocular tracking shows moderate saccadic breakdown without nystagmus noted. There is limitation to upper gaze. There is mild decrease in eye blink rate. Hearing is intact. Tympanic membranes are obscured by cerumen bilaterally, with ear tube on the R. Face is symmetric with mild facial masking and normal  facial sensation. There is no lip, neck or jaw tremor. Neck is mildly rigid with intact passive ROM. There are no carotid bruits on auscultation. Oropharynx exam reveals moderate mouth dryness. No significant airway crowding is noted. Mallampati is class III. Tongue protrudes centrally and palate elevates symmetrically.   There is no drooling.   Chest: is clear to auscultation without wheezing, rhonchi or crackles noted.  Heart: sounds are regular and normal without murmurs, rubs or gallops noted.   Abdomen: is soft, non-tender and non-distended with normal bowel sounds appreciated on auscultation.  Extremities: There is no pitting edema in the distal lower extremities bilaterally. Pedal pulses are intact.  Skin: is warm and dry with no trophic changes noted. Age-related changes are noted on the skin.   Musculoskeletal: exam reveals no obvious joint deformities, tenderness, joint swelling or erythema.  Neurologically:  Mental status: The patient is awake and alert, paying good  attention. He is able to partially provide the history. His wife provides details. He is oriented to: person, place, time/date, situation, day of week, month of year and year. His memory, attention, language and knowledge are mildly impaired. There is no aphasia,  agnosia, apraxia or anomia. There is a mild degree of bradyphrenia. Speech is mildly hypophonic with severe dysarthria noted and mild to moderate degree of scanning. Mood is mildly depressed appearing and affect is blunted.   On 07/08/13: MMSE score is 26/30. CDT is 3/4. AFT (Animal Fluency Test) score is 9.  01/08/2014: MMSE 26/30, CDT is 3/4, AFT 18/min. Geriatric depression score is 12 out of 15.  Cranial nerves are as described above under HEENT exam. In addition, shoulder shrug is normal with equal shoulder height noted.  Motor exam: Normal bulk, and strength for age is noted. There are no dyskinesias noted.  Tone is not rigid with absence of cogwheeling in the extremities. There is overall moderate bradykinesia. There is no drift, but he has evidence of rebound bilaterally.  There is no tremor.   Romberg is positive.  Reflexes are 1+ in the upper extremities and trace in the lower extremities. Toes are downgoing bilaterally.  Fine motor skills exam: Finger taps are moderately impaired on the right and moderately impaired on the left. Hand movements are moderately impaired on the right and moderately impaired on the left. RAP (rapid alternating patting) is moderately impaired on the right and moderately impaired on the left. Foot taps are moderately impaired on the right and moderately impaired on the left. Foot agility (in the form of heel stomping) is moderately impaired on the right and moderately impaired on the left.    Cerebellar testing shows moderate dysmetria a mild intention tremor on finger to nose testing. Heel to shin is moderately impaired bilaterally. There is very mild truncal ataxia.   Sensory exam is intact to light touch, pinprick, vibration, temperature sense in the upper and lower extremities.   Gait, station and balance: He stands up from the seated position with mild difficulty and does need to push up with His hands. He needs no assistance. No veering to one side is noted.  He is not noted to lean to the side. Posture is mildly stooped for age. Stance is wide-based. He walks with his rolling walker with an ataxic, wide-base gait and difficulty with turns. His upper body turns faster than his lower body and he therefore turns the walker faster than his legs or feet can catch up with, this looks unchanged from last time. Tandem walk is  not tested d/t obvious balance impairment. He is not able to do a toe or heel stance.     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 He has evidence of depression and some anxiety and is again encouraged to discuss treatment of depression with his primary care physician. His memory is stable, in fact his category fluency is better. He most likely has mild cognitive impairment. His recent swallow study results and other test results were discussed including his brain MRI findings. Physical exam is for the most part stable. He has noted worsening balance and has been using his rolling walker more consistently. He had a sleep study which apparently did not show any evidence of sleep disordered breathing. I again had a long discussion with the patient and his wife today regarding the progressive nature of his condition. At this juncture, I have advised them to discuss his mood disorder with his primary care physician, I will make a referral to urology because of worsening nocturia and erectile dysfunction, I suggested he return for follow-up with his ENT physician especially since he has a new ear tube and bilateral cerumen impaction. I again explained to the patient and his wife that his condition is invariably progressive and it is hard to say how quickly it will progress. Most likely he will see worsening of his balance and his walking and speech and swallowing difficulties with time. His memory may get  worse as well, but for now appears to be quite stable. At this juncture, I did not suggest any new medications from my end of things.   I answered all their questions today and the patient and his wife were in agreement with the above outlined plan. I would like to see the patient back in 6 months, sooner if the need arises and encouraged them to call with any interim questions, concerns, problems or updates.  Most of my 40 minute visit today was spent in counseling and coordination of care, reviewing test results and reviewing medication.

## 2014-01-08 NOTE — Patient Instructions (Addendum)
Your exam and memory are stable. We will do a referral to urology.   Talk to Dr. Renne CriglerPharr and Corrie DandyMary about depression.   Go back to ENT about wax impaction.   Remember to drink plenty of fluid, eat healthy meals and do not skip any meals. Try to eat protein with a every meal and eat a healthy snack such as fruit or nuts in between meals. Try to keep a regular sleep-wake schedule and try to exercise daily, particularly in the form of walking, if you can.   Try to stay active physically and mentally. Engage in social activities in your community and with your family and try to keep up with current events by reading the newspaper or watching the news. Try to do word puzzles and you may like to do word puzzles and brain games on the computer such as on [email or web address].   I would like to see you back in 6 months, sooner if we need to. Please call us with any interim questions, concerns, problems, updates or refill requests.  Please also call us for any test results so we can go over those with you on the phone. Our nursing staff will answer any of your questions and relay your messages to me and also relay most of my messages to you.  Our phone number is [phone]. We also have an after hours call service for urgent matters and there is a physician on-call for urgent questions, that cannot wait till the next work day. For any emergencies you know to call 911 or go to the nearest emergency room.

## 2014-03-20 DIAGNOSIS — I10 Essential (primary) hypertension: Secondary | ICD-10-CM | POA: Diagnosis not present

## 2014-03-20 DIAGNOSIS — E780 Pure hypercholesterolemia: Secondary | ICD-10-CM | POA: Diagnosis not present

## 2014-05-19 DIAGNOSIS — H2513 Age-related nuclear cataract, bilateral: Secondary | ICD-10-CM | POA: Diagnosis not present

## 2014-05-19 DIAGNOSIS — G3281 Cerebellar ataxia in diseases classified elsewhere: Secondary | ICD-10-CM | POA: Diagnosis not present

## 2014-05-19 DIAGNOSIS — H401233 Low-tension glaucoma, bilateral, severe stage: Secondary | ICD-10-CM | POA: Diagnosis not present

## 2014-07-09 ENCOUNTER — Encounter: Payer: Self-pay | Admitting: Neurology

## 2014-07-09 ENCOUNTER — Ambulatory Visit (INDEPENDENT_AMBULATORY_CARE_PROVIDER_SITE_OTHER): Payer: BLUE CROSS/BLUE SHIELD | Admitting: Neurology

## 2014-07-09 VITALS — BP 154/94 | HR 83 | Resp 16 | Ht 70.0 in | Wt 202.0 lb

## 2014-07-09 DIAGNOSIS — R471 Dysarthria and anarthria: Secondary | ICD-10-CM | POA: Diagnosis not present

## 2014-07-09 DIAGNOSIS — R413 Other amnesia: Secondary | ICD-10-CM | POA: Diagnosis not present

## 2014-07-09 DIAGNOSIS — R351 Nocturia: Secondary | ICD-10-CM

## 2014-07-09 DIAGNOSIS — G238 Other specified degenerative diseases of basal ganglia: Secondary | ICD-10-CM | POA: Diagnosis not present

## 2014-07-09 DIAGNOSIS — N528 Other male erectile dysfunction: Secondary | ICD-10-CM

## 2014-07-09 DIAGNOSIS — F32A Depression, unspecified: Secondary | ICD-10-CM

## 2014-07-09 DIAGNOSIS — F329 Major depressive disorder, single episode, unspecified: Secondary | ICD-10-CM

## 2014-07-09 NOTE — Patient Instructions (Signed)
Check with your PCP if you are supposed to take the Hyzaar 2 times a day, as the diuretic may contribute to your nighttime bathroom breaks.   We can consider a referral to urology.   Talk to Dr. Renne CriglerPharr about: 1. BP medication, 2. Depression, 3. Urology referral.   Follow up with me in 6 months.

## 2014-07-09 NOTE — Progress Notes (Signed)
Subjective:    Patient ID: Donald Stokes is a 69 y.o. male.  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 He is accompanied by his wife again today. I last saw him on 01/08/2014, at which time he reported having had a sleep study in May 2015 and [place]. He reported that they were told he had no sleep apnea. He saw ENT and was treated with antibiotics for an ear infection and an ear tube was placed. He reported worsening balance. He reported more nocturia. His wife felt that he was depressed. I made a referral to urology. I also encouraged him to discuss depression treatment with his primary care physician. His memory scores were stable. His MMSE was 26 out of 30 at the time.  Today, 07/09/2014: He is able to provide some of his history and his wife provides some history. He report feeling fairly stable. He has nocturia about 3 times on an average night. He had a recent change in his blood pressure medication and that he no longer takes amlodipine and he takes Hyzaar twice daily as I understand. He did not address his depression with his primary care physician at the last visit which was in December. He did not end up seeing urology and for some reason the referral never came to for wish and as I understand. He did not see his ENT physician back either. She's worried about his depression. On the positive side, they just came back from a vacation trip to the mountains which she believes did them both good. He has not had any recent falls thankfully. He has not had any recent choking spells. He believes his memory is fairly stable. She has noticed some additional decline but nothing major.   Previously:  I first met him on  07/08/2013 at the request of his primary care physician, at which time the patient reported forgetfulness and short-term memory issues. He also reported some swallowing issues. He was using a rolling walker and also had a scooter for longer distances and inside the house he was using a cane or was holding onto things. I suggested referral to home health occupational therapy. He was advised to no longer drive. I requested a brain MRI without contrast. He had this on 07/30/2013: Abnormal MRI brain (without) demonstrating: 1. Severe pontine and cerebellar atrophy. 2. Few punctate foci of periventricular and subcortical foci of non-specific gliosis. 3. Severe fluid/inflammation in the right mastoid air cells. Non-specific mild fluid and mucosal thickening in the sphenoid, ethmoid and maxillary sinuses. I also ordered a swallow study. He had this on 07/26/2013: This did not show any evidence of aspiration and he did not have any coughing or choking during the procedure. He was not advised to change his food or liquid consistency. General recommendations were given regarding aspiration precautions.   He was seen by Dr. 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 He had a brain MRI in 03/1999, which apparently showed prominent cerebellar and brainstem atrophy. He has not had a swallow study. He does not have a FHx of cerebellar ataxia. His son has MS.   He has fallen, but not in the last 1 1/2 months and  thankfully has not hurt himself. He tends to trip and catches himself. He has sleep issues, including insomnia at night and EDS. He wakes with up with SOB, but does not have apneic pauses. In fact, he had a sleep study in [place] some 2 weeks ago, which per wife did not show any significant sleep disorder. He has control of his B/B function. He reports some depression and anxiety, but denies SI/HI, and has no hallucinations, no delusions, and no evidence of  RBD. He sleeps poorly and is restless. He sleeps best between 5 AM and 8 AM. He denies RLS Sx and there is no report of PLMs. He still drives.   His Past Medical History Is Significant For: Past Medical History  Diagnosis Date  . Memory loss   . Mixed hyperlipidemia   . Other abnormal glucose   . Other degenerative diseases of the basal ganglia   . Abnormality of gait   . Unspecified essential hypertension   . Other drug allergy(995.27)   . Hypertrophy of prostate without urinary obstruction and other lower urinary tract symptoms (LUTS)   . Allergic rhinitis, cause unspecified   . Overweight(278.02)   . 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  His Family History Is Significant For: Family History  Problem Relation Age of Onset  . Cancer Mother   . Heart failure Father   . Multiple sclerosis Son   . Stroke Maternal Grandmother   . Heart failure Maternal Uncle     His Social History Is Significant For: History   Social History  . Marital Status: Married    Spouse Name: Donald Stokes  . Number of Children: 4  . Years of Education: 12   Occupational History  .      retired   Social History Main Topics  . Smoking status: Never Smoker   . Smokeless tobacco: Never Used  . Alcohol Use: No  . Drug Use: No  . Sexual Activity: Not on file   Other Topics Concern  . None   Social History Narrative   Patient is right handed and consumes no caffeine    His Allergies Are:  Allergies  Allergen Reactions  . Captopril     sexual impairment   . Corgard [Nadolol]     sexual impairment   . Cozaar [Losartan Potassium]     erectile disfunction  . Inderal La [Propranolol Hcl]     sexual impairment  . Minipress [Prazosin]     sexual impairment  . Reserpine     sexual impairment  . Tenormin [Atenolol]     sexual impairment  . Welchol [Colesevelam Hcl]     constipation  . Zocor [Simvastatin]      increased CK  :   His Current Medications Are:  Outpatient Encounter Prescriptions as of 07/09/2014  Medication Sig  . amLODipine (NORVASC) 10 MG tablet Take 10 mg by mouth daily.  Marland Kitchen aspirin 81 MG tablet Take 81 mg by mouth daily.  Marland Kitchen ezetimibe (ZETIA) 10 MG tablet Take 10 mg by mouth daily.  . fenofibrate (TRICOR) 145 MG tablet Take 145 mg by mouth daily.  Marland Kitchen losartan-hydrochlorothiazide (HYZAAR) 50-12.5 MG per tablet Take 1 tablet by mouth daily.  . tamsulosin (FLOMAX) 0.4 MG CAPS capsule Take 0.4 mg by mouth daily.   No facility-administered encounter medications on file as of 07/09/2014.  :  Review of Systems:  Out of a complete  14 point review of systems, all are reviewed and negative with the exception of these symptoms as listed below:   Review of Systems  Neurological: Positive for weakness.    Objective:  Neurologic Exam  Physical Exam Physical Examination:   Filed Vitals:   07/09/14 1155  BP: 154/94  Pulse: 83  Resp: 16   General Examination: The patient is a very pleasant 70 y.o. male in no acute distress.  HEENT: Normocephalic, atraumatic, pupils are equal, round and reactive to light and accommodation. Funduscopic exam is normal with sharp disc margins noted. Extraocular tracking shows moderate saccadic breakdown without nystagmus noted. There is limitation to upper gaze and downgaze. There is mild decrease in eye blink rate. Hearing is intact. Face is symmetric with mild to moderate facial masking and normal facial sensation. There is no lip, neck or jaw tremor. Neck is mildly rigid with intact passive ROM. There are no carotid bruits on auscultation. Oropharynx exam reveals moderate mouth dryness. No significant airway crowding is noted. Mallampati is class III. Tongue protrudes centrally and palate elevates symmetrically. There is no drooling.   Chest: is clear to auscultation without wheezing, rhonchi or crackles noted.  Heart: sounds are regular and normal  without murmurs, rubs or gallops noted.   Abdomen: is soft, non-tender and non-distended with normal bowel sounds appreciated on auscultation.  Extremities: There is no pitting edema in the distal lower extremities bilaterally. Pedal pulses are intact.  Skin: is warm and dry with no trophic changes noted. Age-related changes are noted on the skin.   Musculoskeletal: exam reveals no obvious joint deformities, tenderness, joint swelling or erythema.  Neurologically:  Mental status: The patient is awake and alert, paying good attention. He is able to partially provide the history. His wife provides details. He is oriented to: person, place, time/date, situation, day of week, month of year and year. His memory, attention, language and knowledge are mildly impaired. There is no aphasia, agnosia, apraxia or anomia. There is a mild degree of bradyphrenia. Speech is mildly hypophonic with severe dysarthria noted and mild to moderate degree of scanning. Mood is mildly depressed appearing and affect is blunted.   On 07/08/13: MMSE score is 26/30. CDT is 3/4. AFT (Animal Fluency Test) score is 9.  On 01/08/2014: MMSE 26/30, CDT is 3/4, AFT 18/min. Geriatric depression score is 12 out of 15. On 07/09/2014: MMSE: 25/30, CDT: 3/4, AFT: 11/min. GDS: 12/15.  Cranial nerves are as described above under HEENT exam. In addition, shoulder shrug is normal with equal shoulder height noted.  Motor exam: Normal bulk, and strength for age is noted. There are no dyskinesias noted.  Tone is not rigid with absence of cogwheeling in the extremities. There is overall moderate bradykinesia. There is no drift, but he has evidence of rebound bilaterally.  There is no tremor.   Romberg is positive.  Reflexes are 1+ in the upper extremities and trace in the lower extremities. Toes are downgoing bilaterally.  Fine motor skills exam: Finger taps are moderately impaired on the right and moderately impaired on the left. Hand movements  are moderately impaired on the right and moderately impaired on the left. RAP (rapid alternating patting) is moderately impaired on the right and moderately impaired on the left. Foot taps are moderately impaired on the right and moderately impaired on the left. Foot agility (in the form of heel stomping) is moderately impaired on the right and moderately impaired on the left.    Cerebellar testing shows moderate  dysmetria a mild intention tremor on finger to nose testing. Heel to shin is moderately impaired bilaterally. There is very mild truncal ataxia.   Sensory exam is intact to light touch, pinprick, vibration, temperature sense in the upper and lower extremities.   Gait, station and balance: He stands up from the seated position with mild difficulty and does need to push up with His hands. He needs no assistance. No veering to one side is noted. He is not noted to lean to the side. Posture is mildly stooped for age. Stance is wide-based. He walks with his rolling walker with an ataxic, wide-base gait and difficulty with turns, largely unchanged. Tandem walk is not tested d/t obvious balance impairment. He is not able to do a toe or heel stance.     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 He has evidence of depression and some anxiety and is again encouraged to discuss treatment of depression with his primary care physician. His memory is fairly stable,  and we mutually agreed to monitor his memory rather than trying to add a memory medication at this juncture. We have previously discussed swallow study results of MRI results. His physical exam is also stable. We will hold off on the urology referral at this time. He is encouraged to clarify with his primary care physician whether he needs to take his Hyzaar twice daily. Blood pressure was borderline  today. He also continues to complain of erectile dysfunction. He may benefit from a urology referral and he is also encouraged to address his with his primary care physician. They're encouraged to make an appointment for follow-up with his PCP to discuss these issues.  I answered all their questions today and the patient and his wife were in agreement with the above outlined plan. I would like to see the patient back in 6 months, sooner if the need arises and encouraged them to call with any interim questions, concerns, problems or updates.  Most of my 25 minute visit today was spent in counseling and coordination of care, reviewing test results and reviewing medication and the Dx of memory loss and MSA, the prognosis and treatment options.

## 2014-09-23 DIAGNOSIS — E780 Pure hypercholesterolemia: Secondary | ICD-10-CM | POA: Diagnosis not present

## 2014-09-23 DIAGNOSIS — I10 Essential (primary) hypertension: Secondary | ICD-10-CM | POA: Diagnosis not present

## 2014-09-23 DIAGNOSIS — R410 Disorientation, unspecified: Secondary | ICD-10-CM | POA: Diagnosis not present

## 2014-10-21 DIAGNOSIS — H2513 Age-related nuclear cataract, bilateral: Secondary | ICD-10-CM | POA: Diagnosis not present

## 2014-10-21 DIAGNOSIS — H401233 Low-tension glaucoma, bilateral, severe stage: Secondary | ICD-10-CM | POA: Diagnosis not present

## 2014-11-04 DIAGNOSIS — E780 Pure hypercholesterolemia: Secondary | ICD-10-CM | POA: Diagnosis not present

## 2014-11-04 DIAGNOSIS — I10 Essential (primary) hypertension: Secondary | ICD-10-CM | POA: Diagnosis not present

## 2014-11-18 DIAGNOSIS — R312 Other microscopic hematuria: Secondary | ICD-10-CM | POA: Diagnosis not present

## 2014-11-18 DIAGNOSIS — L723 Sebaceous cyst: Secondary | ICD-10-CM | POA: Diagnosis not present

## 2014-11-18 DIAGNOSIS — R350 Frequency of micturition: Secondary | ICD-10-CM | POA: Diagnosis not present

## 2014-11-19 ENCOUNTER — Other Ambulatory Visit: Payer: Self-pay | Admitting: Urology

## 2014-12-19 ENCOUNTER — Encounter (HOSPITAL_BASED_OUTPATIENT_CLINIC_OR_DEPARTMENT_OTHER): Payer: Self-pay | Admitting: *Deleted

## 2014-12-19 NOTE — Progress Notes (Signed)
NPO AFTER MN.  ARRIVE AT 1030.  NEEDS ISTAT AND EKG.  WILL TAKE AM MEDS W/ SIPS OF WATER DOS.

## 2014-12-26 ENCOUNTER — Other Ambulatory Visit: Payer: Self-pay

## 2014-12-26 ENCOUNTER — Encounter (HOSPITAL_BASED_OUTPATIENT_CLINIC_OR_DEPARTMENT_OTHER)
Admission: RE | Disposition: A | Discharge status: Home / Self Care | Payer: Self-pay | Source: Ambulatory Visit | Attending: Urology

## 2014-12-26 ENCOUNTER — Ambulatory Visit (HOSPITAL_BASED_OUTPATIENT_CLINIC_OR_DEPARTMENT_OTHER): Payer: BLUE CROSS/BLUE SHIELD | Admitting: Anesthesiology

## 2014-12-26 ENCOUNTER — Ambulatory Visit (HOSPITAL_BASED_OUTPATIENT_CLINIC_OR_DEPARTMENT_OTHER)
Admission: RE | Admit: 2014-12-26 | Discharge: 2014-12-26 | Disposition: A | Discharge status: Home / Self Care | Payer: BLUE CROSS/BLUE SHIELD | Source: Ambulatory Visit | Attending: Urology | Admitting: Urology

## 2014-12-26 ENCOUNTER — Encounter (HOSPITAL_BASED_OUTPATIENT_CLINIC_OR_DEPARTMENT_OTHER): Payer: Self-pay | Admitting: *Deleted

## 2014-12-26 DIAGNOSIS — I10 Essential (primary) hypertension: Secondary | ICD-10-CM | POA: Diagnosis not present

## 2014-12-26 DIAGNOSIS — R3129 Other microscopic hematuria: Secondary | ICD-10-CM | POA: Diagnosis not present

## 2014-12-26 DIAGNOSIS — Z79899 Other long term (current) drug therapy: Secondary | ICD-10-CM | POA: Diagnosis not present

## 2014-12-26 DIAGNOSIS — N400 Enlarged prostate without lower urinary tract symptoms: Secondary | ICD-10-CM | POA: Diagnosis not present

## 2014-12-26 DIAGNOSIS — L723 Sebaceous cyst: Secondary | ICD-10-CM | POA: Diagnosis not present

## 2014-12-26 DIAGNOSIS — L720 Epidermal cyst: Secondary | ICD-10-CM | POA: Diagnosis not present

## 2014-12-26 DIAGNOSIS — Z7982 Long term (current) use of aspirin: Secondary | ICD-10-CM | POA: Insufficient documentation

## 2014-12-26 DIAGNOSIS — E782 Mixed hyperlipidemia: Secondary | ICD-10-CM | POA: Insufficient documentation

## 2014-12-26 HISTORY — DX: Dysarthria and anarthria: R47.1

## 2014-12-26 HISTORY — DX: Other microscopic hematuria: R31.29

## 2014-12-26 HISTORY — DX: Major depressive disorder, single episode, unspecified: F32.9

## 2014-12-26 HISTORY — DX: Depression, unspecified: F32.A

## 2014-12-26 HISTORY — DX: Presence of spectacles and contact lenses: Z97.3

## 2014-12-26 HISTORY — DX: Benign prostatic hyperplasia without lower urinary tract symptoms: N40.0

## 2014-12-26 HISTORY — PX: SCROTAL EXPLORATION: SHX2386

## 2014-12-26 HISTORY — DX: Claustrophobia: F40.240

## 2014-12-26 HISTORY — DX: Male erectile dysfunction, unspecified: N52.9

## 2014-12-26 HISTORY — DX: Unspecified symptoms and signs involving the genitourinary system: R39.9

## 2014-12-26 HISTORY — PX: CYSTOSCOPY: SHX5120

## 2014-12-26 HISTORY — DX: Sebaceous cyst: L72.3

## 2014-12-26 HISTORY — DX: Unspecified glaucoma: H40.9

## 2014-12-26 LAB — POCT I-STAT 4, (NA,K, GLUC, HGB,HCT)
Glucose, Bld: 98 mg/dL (ref 65–99)
HCT: 47 % (ref 39.0–52.0)
Hemoglobin: 16 g/dL (ref 13.0–17.0)
POTASSIUM: 3.8 mmol/L (ref 3.5–5.1)
SODIUM: 143 mmol/L (ref 135–145)

## 2014-12-26 SURGERY — EXPLORATION, SCROTUM
Anesthesia: Monitor Anesthesia Care | Site: Penis

## 2014-12-26 MED ORDER — LACTATED RINGERS IV SOLN
INTRAVENOUS | Status: DC
  Administered 2014-12-26: 12:00:00 via INTRAVENOUS
  Filled 2014-12-26: qty 1000

## 2014-12-26 MED ORDER — CEFAZOLIN SODIUM 1-5 GM-% IV SOLN
1.0000 g | INTRAVENOUS | Status: AC
  Administered 2014-12-26: 2 g via INTRAVENOUS
  Filled 2014-12-26: qty 50

## 2014-12-26 MED ORDER — PROPOFOL 10 MG/ML IV BOLUS
INTRAVENOUS | Status: DC | PRN
  Administered 2014-12-26: 20 mg via INTRAVENOUS
  Administered 2014-12-26: 180 mg via INTRAVENOUS

## 2014-12-26 MED ORDER — PROMETHAZINE HCL 25 MG/ML IJ SOLN
6.2500 mg | INTRAMUSCULAR | Status: DC | PRN
  Filled 2014-12-26: qty 1

## 2014-12-26 MED ORDER — FENTANYL CITRATE (PF) 100 MCG/2ML IJ SOLN
INTRAMUSCULAR | Status: AC
  Filled 2014-12-26: qty 4

## 2014-12-26 MED ORDER — MEPERIDINE HCL 25 MG/ML IJ SOLN
6.2500 mg | INTRAMUSCULAR | Status: DC | PRN
  Filled 2014-12-26: qty 1

## 2014-12-26 MED ORDER — MIDAZOLAM HCL 2 MG/2ML IJ SOLN
INTRAMUSCULAR | Status: AC
  Filled 2014-12-26: qty 2

## 2014-12-26 MED ORDER — MIDAZOLAM HCL 5 MG/5ML IJ SOLN
INTRAMUSCULAR | Status: DC | PRN
  Administered 2014-12-26: 1 mg via INTRAVENOUS

## 2014-12-26 MED ORDER — SUCCINYLCHOLINE CHLORIDE 20 MG/ML IJ SOLN
INTRAMUSCULAR | Status: DC | PRN
  Administered 2014-12-26: 100 mg via INTRAVENOUS

## 2014-12-26 MED ORDER — LIDOCAINE HCL 1 % IJ SOLN
INTRAMUSCULAR | Status: DC | PRN
  Administered 2014-12-26: 1 mL

## 2014-12-26 MED ORDER — BUPIVACAINE HCL (PF) 0.25 % IJ SOLN
INTRAMUSCULAR | Status: DC | PRN
  Administered 2014-12-26: 1 mL

## 2014-12-26 MED ORDER — FENTANYL CITRATE (PF) 100 MCG/2ML IJ SOLN
25.0000 ug | INTRAMUSCULAR | Status: DC | PRN
  Filled 2014-12-26: qty 1

## 2014-12-26 MED ORDER — HYDROCODONE-ACETAMINOPHEN 5-325 MG PO TABS: 1.0000 | ORAL_TABLET | Freq: Four times a day (QID) | ORAL | Status: DC | PRN

## 2014-12-26 MED ORDER — CEFAZOLIN SODIUM-DEXTROSE 2-3 GM-% IV SOLR
INTRAVENOUS | Status: AC
  Filled 2014-12-26: qty 50

## 2014-12-26 MED ORDER — FENTANYL CITRATE (PF) 100 MCG/2ML IJ SOLN
INTRAMUSCULAR | Status: DC | PRN
  Administered 2014-12-26: 50 ug via INTRAVENOUS
  Administered 2014-12-26 (×2): 25 ug via INTRAVENOUS

## 2014-12-26 MED ORDER — SULFAMETHOXAZOLE-TRIMETHOPRIM 800-160 MG PO TABS: 1.0000 | ORAL_TABLET | Freq: Two times a day (BID) | ORAL | Status: DC

## 2014-12-26 SURGICAL SUPPLY — 70 items
APL SKNCLS STERI-STRIP NONHPOA (GAUZE/BANDAGES/DRESSINGS) ×2
APPLICATOR COTTON TIP 6IN STRL (MISCELLANEOUS) IMPLANT
BAG URINE DRAINAGE (UROLOGICAL SUPPLIES) IMPLANT
BENZOIN TINCTURE PRP APPL 2/3 (GAUZE/BANDAGES/DRESSINGS) ×2 IMPLANT
BLADE CLIPPER SURG (BLADE) IMPLANT
BLADE SURG 15 STRL LF DISP TIS (BLADE) ×2 IMPLANT
BLADE SURG 15 STRL SS (BLADE) ×4
CATH FOLEY 2WAY SLVR  5CC 16FR (CATHETERS)
CATH FOLEY 2WAY SLVR 5CC 16FR (CATHETERS) IMPLANT
CLOSURE WOUND 1/2 X4 (GAUZE/BANDAGES/DRESSINGS) ×1
COVER BACK TABLE 60X90IN (DRAPES) ×4 IMPLANT
COVER MAYO STAND STRL (DRAPES) ×4 IMPLANT
COVER PROBE W GEL 5X96 (DRAPES) IMPLANT
DISSECTOR ROUND CHERRY 3/8 STR (MISCELLANEOUS) IMPLANT
DRAIN PENROSE 18X1/2 LTX STRL (DRAIN) ×4 IMPLANT
DRAPE PED LAPAROTOMY (DRAPES) ×4 IMPLANT
DRSG TEGADERM 4X4.75 (GAUZE/BANDAGES/DRESSINGS) ×2 IMPLANT
ELECT NDL TIP 2.8 STRL (NEEDLE) ×2 IMPLANT
ELECT NEEDLE TIP 2.8 STRL (NEEDLE) ×4 IMPLANT
ELECT REM PT RETURN 9FT ADLT (ELECTROSURGICAL) ×4
ELECTRODE REM PT RTRN 9FT ADLT (ELECTROSURGICAL) ×2 IMPLANT
GLOVE BIO SURGEON STRL SZ 6.5 (GLOVE) ×1 IMPLANT
GLOVE BIO SURGEON STRL SZ8 (GLOVE) ×4 IMPLANT
GLOVE BIO SURGEONS STRL SZ 6.5 (GLOVE) ×1
GLOVE BIOGEL PI IND STRL 6.5 (GLOVE) IMPLANT
GLOVE BIOGEL PI IND STRL 7.0 (GLOVE) IMPLANT
GLOVE BIOGEL PI IND STRL 7.5 (GLOVE) IMPLANT
GLOVE BIOGEL PI INDICATOR 6.5 (GLOVE) ×2
GLOVE BIOGEL PI INDICATOR 7.0 (GLOVE) ×2
GLOVE BIOGEL PI INDICATOR 7.5 (GLOVE) ×2
GOWN STRL REUS W/ TWL LRG LVL3 (GOWN DISPOSABLE) ×2 IMPLANT
GOWN STRL REUS W/ TWL XL LVL3 (GOWN DISPOSABLE) ×2 IMPLANT
GOWN STRL REUS W/TWL LRG LVL3 (GOWN DISPOSABLE) ×8
GOWN STRL REUS W/TWL XL LVL3 (GOWN DISPOSABLE) ×4
KIT ROOM TURNOVER WOR (KITS) ×4 IMPLANT
LIQUID BAND (GAUZE/BANDAGES/DRESSINGS) IMPLANT
MANIFOLD NEPTUNE II (INSTRUMENTS) IMPLANT
NDL HYPO 25X1 1.5 SAFETY (NEEDLE) ×2 IMPLANT
NEEDLE HYPO 25X1 1.5 SAFETY (NEEDLE) ×4 IMPLANT
NS IRRIG 500ML POUR BTL (IV SOLUTION) IMPLANT
PACK BASIN DAY SURGERY FS (CUSTOM PROCEDURE TRAY) ×4 IMPLANT
PENCIL BUTTON HOLSTER BLD 10FT (ELECTRODE) ×4 IMPLANT
SPONGE GAUZE 4X4 12PLY STER LF (GAUZE/BANDAGES/DRESSINGS) ×2 IMPLANT
STRIP CLOSURE SKIN 1/2X4 (GAUZE/BANDAGES/DRESSINGS) ×1 IMPLANT
SUPPORT SCROTAL LG STRP (MISCELLANEOUS) ×3 IMPLANT
SUPPORTER ATHLETIC LG (MISCELLANEOUS) ×1
SUT CHROMIC 3 0 SH 27 (SUTURE) IMPLANT
SUT CHROMIC 4 0 PS 2 18 (SUTURE) IMPLANT
SUT CHROMIC 4 0 SH 27 (SUTURE) IMPLANT
SUT CHROMIC GUT AB #0 18 (SUTURE) IMPLANT
SUT MNCRL AB 4-0 PS2 18 (SUTURE) ×2 IMPLANT
SUT PROLENE 4 0 RB 1 (SUTURE)
SUT PROLENE 4-0 RB1 .5 CRCL 36 (SUTURE) IMPLANT
SUT SILK 0 SH 30 (SUTURE) IMPLANT
SUT SILK 0 TIES 10X30 (SUTURE) IMPLANT
SUT VIC AB 0 SH 27 (SUTURE) IMPLANT
SUT VIC AB 2-0 SH 27 (SUTURE)
SUT VIC AB 2-0 SH 27XBRD (SUTURE) IMPLANT
SUT VIC AB 3-0 SH 27 (SUTURE) ×4
SUT VIC AB 3-0 SH 27X BRD (SUTURE) IMPLANT
SUT VICRYL 2 0 18  UND BR (SUTURE)
SUT VICRYL 2 0 18 UND BR (SUTURE) IMPLANT
SYR 30ML LL (SYRINGE) IMPLANT
SYR CONTROL 10ML LL (SYRINGE) ×4 IMPLANT
TOWEL OR 17X24 6PK STRL BLUE (TOWEL DISPOSABLE) ×8 IMPLANT
TRAY DSU PREP LF (CUSTOM PROCEDURE TRAY) ×4 IMPLANT
TUBE CONNECTING 12'X1/4 (SUCTIONS)
TUBE CONNECTING 12X1/4 (SUCTIONS) IMPLANT
WATER STERILE IRR 500ML POUR (IV SOLUTION) IMPLANT
YANKAUER SUCT BULB TIP NO VENT (SUCTIONS) IMPLANT

## 2014-12-26 NOTE — Anesthesia Preprocedure Evaluation (Addendum)
Anesthesia Evaluation  Patient identified by MRN, date of birth, ID band Patient awake    Reviewed: Allergy & Precautions, NPO status , Patient's Chart, lab work & pertinent test results  Airway Mallampati: II  TM Distance: >3 FB Neck ROM: Full    Dental no notable dental hx.    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 negative psych ROS   GI/Hepatic negative GI ROS, Neg liver ROS,   Endo/Other  negative endocrine ROS  Renal/GU negative Renal ROS  negative genitourinary   Musculoskeletal negative musculoskeletal ROS (+)   Abdominal   Peds negative pediatric ROS (+)  Hematology negative hematology ROS (+)   Anesthesia Other Findings   Reproductive/Obstetrics negative OB ROS                            Anesthesia Physical Anesthesia Plan  ASA: II  Anesthesia Plan: General and MAC   Post-op Pain Management:    Induction: Intravenous  Airway Management Planned: LMA and Simple Face Mask  Additional Equipment:   Intra-op Plan:   Post-operative Plan: Extubation in OR  Informed Consent: I have reviewed the patients History and Physical, chart, labs and discussed the procedure including the risks, benefits and alternatives for the proposed anesthesia with the patient or authorized representative who has indicated his/her understanding and acceptance.   Dental advisory given  Plan Discussed with: CRNA  Anesthesia Plan Comments:        Anesthesia Quick Evaluation

## 2014-12-26 NOTE — Anesthesia Procedure Notes (Signed)
Procedure Name: Intubation Date/Time: 12/26/2014 12:36 PM Performed by: Maris BergerENENNY, Christopherjame Carnell T Pre-anesthesia Checklist: Patient identified, Emergency Drugs available, Suction available and Patient being monitored Patient Re-evaluated:Patient Re-evaluated prior to inductionOxygen Delivery Method: Circle System Utilized Preoxygenation: Pre-oxygenation with 100% oxygen Intubation Type: IV induction Ventilation: Mask ventilation without difficulty Laryngoscope Size: Mac and 4 Grade View: Grade II Tube type: Oral Tube size: 8.0 mm Number of attempts: 1 Airway Equipment and Method: Stylet and Oral airway Placement Confirmation: ETT inserted through vocal cords under direct vision,  positive ETCO2 and breath sounds checked- equal and bilateral Secured at: 22 cm Tube secured with: Tape Dental Injury: Teeth and Oropharynx as per pre-operative assessment

## 2014-12-26 NOTE — Transfer of Care (Signed)
Immediate Anesthesia Transfer of Care Note  Patient: Donald Stokes  Procedure(s) Performed: Procedure(s): LEFT INGUINAL SEBACEOUS CYST EXCISION (Left) CYSTOSCOPY (N/A)  Patient Location: PACU  Anesthesia Type:General  Level of Consciousness: awake and confused  Airway & Oxygen Therapy: Patient Spontanous Breathing and Patient connected to face mask oxygen  Post-op Assessment: Report given to RN  Post vital signs: Reviewed and stable  Last Vitals:  Filed Vitals:   12/26/14 1201  BP: 181/102  Pulse: 72  Temp:     Complications: No apparent anesthesia complications

## 2014-12-26 NOTE — Brief Op Note (Signed)
12/26/2014  1:06 PM  PATIENT:  Donald Stokes  69 y.o. male  PRE-OPERATIVE DIAGNOSIS:  LEFT  INGUINAL SEBACEOUS CYSTO, MICROHEMATURIA  POST-OPERATIVE DIAGNOSIS:  LEFT  INGUINAL SEBACEOUS CYSTO, MICROHEMATURIA  PROCEDURE:  Procedure(s): LEFT INGUINAL SEBACEOUS CYST EXCISION (Left) CYSTOSCOPY (N/A)  SURGEON:  Surgeon(s) and Role:    * Malen GauzePatrick L Kedarius Aloisi, MD - Primary  PHYSICIAN ASSISTANT:   ASSISTANTS: none   ANESTHESIA:   general  EBL:  Total I/O In: 700 [I.V.:700] Out: -   BLOOD ADMINISTERED:none  DRAINS: none   LOCAL MEDICATIONS USED:  NONE  SPECIMEN:  Source of Specimen:  left inguinal sebaceous cyst  DISPOSITION OF SPECIMEN:  PATHOLOGY  COUNTS:  YES  TOURNIQUET:  * No tourniquets in log *  DICTATION: .Note written in EPIC  PLAN OF CARE: Discharge to home after PACU  PATIENT DISPOSITION:  PACU - hemodynamically stable.   Delay start of Pharmacological VTE agent (>24hrs) due to surgical blood loss or risk of bleeding: not applicable

## 2014-12-26 NOTE — Discharge Instructions (Signed)
Cystoscopy Cystoscopy is a procedure that is used to help your caregiver diagnose and sometimes treat conditions that affect your lower urinary tract. Your lower urinary tract includes your bladder and the tube through which urine passes from your bladder out of your body (urethra). Cystoscopy is performed with a thin, tube-shaped instrument (cystoscope). The cystoscope has lenses and a light at the end so that your caregiver can see inside your bladder. The cystoscope is inserted at the entrance of your urethra. Your caregiver guides it through your urethra and into your bladder. There are two main types of cystoscopy:  Flexible cystoscopy (with a flexible cystoscope).  Rigid cystoscopy (with a rigid cystoscope). Cystoscopy may be recommended for many conditions, including:  Urinary tract infections.  Blood in your urine (hematuria).  Loss of bladder control (urinary incontinence) or overactive bladder.  Unusual cells found in a urine sample.  Urinary blockage.  Painful urination. Cystoscopy may also be done to remove a sample of your tissue to be checked under a microscope (biopsy). It may also be done to remove or destroy bladder stones. LET YOUR CAREGIVER KNOW ABOUT:  Allergies to food or medicine.  Medicines taken, including vitamins, herbs, eyedrops, over-the-counter medicines, and creams.  Use of steroids (by mouth or creams).  Previous problems with anesthetics or numbing medicines.  History of bleeding problems or blood clots.  Previous surgery.  Other health problems, including diabetes and kidney problems.  Possibility of pregnancy, if this applies. PROCEDURE The area around the opening to your urethra will be cleaned. A medicine to numb your urethra (local anesthetic) is used. If a tissue sample or stone is removed during the procedure, you may be given a medicine to make you sleep (general anesthetic). Your caregiver will gently insert the tip of the cystoscope  into your urethra. The cystoscope will be slowly glided through your urethra and into your bladder. Sterile fluid will flow through the cystoscope and into your bladder. The fluid will expand and stretch your bladder. This gives your caregiver a better view of your bladder walls. The procedure lasts about 15-20 minutes. AFTER THE PROCEDURE If a local anesthetic is used, you will be allowed to go home as soon as you are ready. If a general anesthetic is used, you will be taken to a recovery area until you are stable. You may have temporary bleeding and burning on urination.   This information is not intended to replace advice given to you by your health care provider. Make sure you discuss any questions you have with your health care provider.   Document Released: 02/19/2000 Document Revised: 03/14/2014 Document Reviewed: 08/15/2011 Elsevier Interactive Patient Education 2016 [employer].  Call your surgeon if you experience:   1.  Fever over 101.0. 2.  Inability to urinate. 3.  Nausea and/or vomiting. 4.  Extreme swelling or bruising at the surgical site. 5.  Continued bleeding from the incision. 6.  Increased pain, redness or drainage from the incision. 7.  Problems related to your pain medication. 8. Any problems and/or concerns Post Anesthesia Home Care Instructions  Activity: Get plenty of rest for the remainder of the day. A responsible adult should stay with you for 24 hours following the procedure.  For the next 24 hours, DO NOT: -Drive a car -Advertising copywriter -Drink alcoholic beverages -Take any medication unless instructed by your physician -Make any legal decisions or sign important papers.  Meals: Start with liquid foods such as gelatin or soup. Progress to regular foods as  tolerated. Avoid greasy, spicy, heavy foods. If nausea and/or vomiting occur, drink only clear liquids until the nausea and/or vomiting subsides. Call your physician if vomiting continues.  Special  Instructions/Symptoms: Your throat may feel dry or sore from the anesthesia or the breathing tube placed in your throat during surgery. If this causes discomfort, gargle with warm salt water. The discomfort should disappear within 24 hours.  If you had a scopolamine patch placed behind your ear for the management of post- operative nausea and/or vomiting:  1. The medication in the patch is effective for 72 hours, after which it should be removed.  Wrap patch in a tissue and discard in the trash. Wash hands thoroughly with soap and water. 2. You may remove the patch earlier than 72 hours if you experience unpleasant side effects which may include dry mouth, dizziness or visual disturbances. 3. Avoid touching the patch. Wash your hands with soap and water after contact with the patch.

## 2014-12-26 NOTE — H&P (Signed)
Urology Admission H&P  Chief Complaint: scrotal mass  History of Present Illness: Mr Donald Stokes is a 69yo here for removal of scrotal sebaceous cyst. He has had it for several years. He laso has a hx of gross hematuria and has undergone workup, minus cystoscopy  Past Medical History  Diagnosis Date  . Mixed hyperlipidemia   . Abnormality of gait     uses cane at home/  walker and or scooter for longer distance  . Unspecified essential hypertension   . Allergic rhinitis, cause unspecified   . 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 BPH (benign prostatic hypertrophy)   . Lower urinary tract symptoms (LUTS)   . Severe dysarthria     speech --  w/ mild hypophonic  . Memory loss     mild  . ED (erectile dysfunction)   . Depression   . Microscopic hematuria   . Sebaceous cyst     left inguinal  . Wears glasses   . Glaucoma of both eyes   . Claustrophobia    Past Surgical History  Procedure Laterality Date  . No past surgeries    . Negative sleep study  April 2016--  per pt    Home Medications:  Prescriptions prior to admission  Medication Sig Dispense Refill Last Dose  . aspirin 81 MG tablet Take 81 mg by mouth daily.   Past Week at Unknown time  . ezetimibe (ZETIA) 10 MG tablet Take 10 mg by mouth daily.   12/26/2014 at 0900  . fenofibrate (TRICOR) 145 MG tablet Take 145 mg by mouth daily.   12/26/2014 at 0900  . telmisartan (MICARDIS) 80 MG tablet Take 80 mg by mouth every morning.   12/26/2014 at 0900  . Travoprost, BAK Free, (TRAVATAN) 0.004 % SOLN ophthalmic solution Place 1 drop into both eyes at bedtime.   12/25/2014 at Unknown time   Allergies: No Active Allergies  Family History  Problem Relation Age of Onset  . Cancer Mother   . Heart failure Father   . Multiple sclerosis Son   . Stroke Maternal Grandmother   . Heart failure Maternal Uncle     Social History:  reports that he has never smoked. He has never used smokeless tobacco. He reports that he does not drink alcohol or use illicit drugs.  Review of Systems  Genitourinary: Positive for hematuria.  All other systems reviewed and are negative.   Physical Exam:  Vital signs in last 24 hours: Temp:  [98.4 F (36.9 C)] 98.4 F (36.9 C) (10/21 1110) Pulse Rate:  [83] 83 (10/21 1110) BP: (197)/(111) 197/111 mmHg (10/21 1110) SpO2:  [98 %] 98 % (10/21 1110) Weight:  [92.08 kg (203 lb)] 92.08 kg (203 lb) (10/21 1110) Physical Exam  Constitutional: He is oriented to person, place, and time. He appears well-developed and well-nourished.  HENT:  Head: Normocephalic and atraumatic.  Eyes: EOM are normal. Pupils are equal, round, and reactive to light.  Neck: Normal range of motion. No thyromegaly present.  Cardiovascular: Normal rate and regular rhythm.   Respiratory: Effort normal. No respiratory distress.  GI: Soft. He exhibits no distension.  Musculoskeletal: Normal range of motion.  Neurological: He is alert and oriented to person, place, and time.  Skin: Skin is warm and dry.  Psychiatric: He has a normal mood and affect. His behavior is normal. Judgment and thought content normal.  Laboratory Data:  No results found for this or any previous visit (from the past 24 hour(s)). No results found for this or any previous visit (from the past 240 hour(s)). Creatinine: No results for input(s): CREATININE in the last 168 hours. Baseline Creatinine: unknwon  Impression/Assessment:  69yo with gross hematuria and sebaceous cyst  Plan:  The risks/benefits/alternatives to cysto and sebaceous cyst excision was explained to the patient and he understands and wishes to proceed with surgery  Jeanmarie Mccowen L 12/26/2014, 11:47 AM

## 2014-12-27 NOTE — Anesthesia Postprocedure Evaluation (Addendum)
  Anesthesia Post-op Note  Patient: Donald Stokes  Procedure(s) Performed: Procedure(s) (LRB): LEFT INGUINAL SEBACEOUS CYST EXCISION (Left) CYSTOSCOPY (N/A)  Patient Location: PACU  Anesthesia Type: General  Level of Consciousness: awake and alert   Airway and Oxygen Therapy: Patient Spontanous Breathing  Post-op Pain: mild  Post-op Assessment: Post-op Vital signs reviewed, Patient's Cardiovascular Status Stable, Respiratory Function Stable, Patent Airway and No signs of Nausea or vomiting  Last Vitals:  Filed Vitals:   12/26/14 1513  BP: 182/96  Pulse: 72  Temp: 36.6 C  Resp: 17    Post-op Vital Signs: stable   Complications: No apparent anesthesia complications

## 2014-12-30 ENCOUNTER — Encounter (HOSPITAL_BASED_OUTPATIENT_CLINIC_OR_DEPARTMENT_OTHER): Payer: Self-pay | Admitting: Urology

## 2014-12-31 NOTE — Op Note (Signed)
Preoperative diagnosis: Microhematuria, left inguinal sebaceous cyst  Postoperative diagnosis: Same  Procedure: Cystoscopy 2. Excision of left inguinal sebaceous cyst, 4cm  Attending: Cleda MccreedyPatrick Mackenzie, MD  Anesthesia: General  History of blood loss: Minimal  Antibiotics: Ancef  Drains: none  Specimens: left sebaceous cyst  Findings: No masses/lesions in the urethra or bladder. Normal anterior and prostatic urethra. Ureteral orifices in normal anatomic location. 4 cm left inguinal sebaceous cyst removed intact.   Indications: Patient is a 69 year old male with a history of microhematuria and an inguinal sebaceous cyst.  After discussing treatment options he decided to proceed with cystoscopy and sebaceous cyst excision   Procedure in detail: Prior to procedure consent was obtained.  Patient was brought to the operating room and a brief timeout was done to ensure correct patient, correct procedure, correct site.  General anesthesia was administered and patient was placed in supine position.  His genitalia and abdomen was then prepped and draped in usual sterile fashion. A flexible cystoscope was advanced into the urethra and then into the bladder. Findings noted above. We then removed the cystoscope and turned our attention to the sebaceous cyst.  A 4 cm incision was made lateral to the cyst and then we proceeded to dissect around the cyst with electrocautery. Care was taken not to leave any cyst capsul behind. Once we were around the  Cyst circumferentially we then freed the inferior attachments and then removed the cyst. We then irrigated the wound with normal saline. We then closed the subcutaneous tissues in 2 layers with 2-0 Vicryl in a running fashion.  We then loosely closed the skin with 4-0 Monocryl in a running fashion.  A dressing was then applied to the incision.  This then concluded the procedure which was well tolerated by the patient.  Complications: None  Condition: Stable,  extubated, transferred to PACU.  Plan: Patient is to be discharged home.  He is to follow up in 2 weeks for wound check.

## 2015-01-12 ENCOUNTER — Other Ambulatory Visit: Payer: Self-pay

## 2015-01-12 ENCOUNTER — Encounter: Payer: Self-pay | Admitting: Neurology

## 2015-01-12 ENCOUNTER — Ambulatory Visit (INDEPENDENT_AMBULATORY_CARE_PROVIDER_SITE_OTHER): Payer: BLUE CROSS/BLUE SHIELD | Admitting: Neurology

## 2015-01-12 VITALS — BP 161/96 | HR 86 | Resp 18 | Ht 70.0 in | Wt 207.0 lb

## 2015-01-12 DIAGNOSIS — G903 Multi-system degeneration of the autonomic nervous system: Secondary | ICD-10-CM

## 2015-01-12 DIAGNOSIS — R413 Other amnesia: Secondary | ICD-10-CM | POA: Diagnosis not present

## 2015-01-12 DIAGNOSIS — G239 Degenerative disease of basal ganglia, unspecified: Principal | ICD-10-CM

## 2015-01-12 DIAGNOSIS — G238 Other specified degenerative diseases of basal ganglia: Secondary | ICD-10-CM

## 2015-01-12 NOTE — Progress Notes (Signed)
Subjective:    Patient ID: Donald Stokes is a 69 y.o. male.  HPI     Interim history:  Mr. Coburn is a very pleasant 69 year old right-handed gentleman with an underlying medical history of hypertension, hyperlipidemia, BPH, allergic rhinitis, obesity and chronic balance problems in the context of a Dx of MSA, who presents for follow-up consultation of his memory loss and MSA-C. He is accompanied by his wife again today. I last saw him on 07/09/2014 at which time he reported feeling fairly stable. He had nocturia about 3 times on an average night. He had a recent change in his blood pressure medication and no longer on amlodipine and he was on Hyzaar twice daily. His memory scores were stable. We agreed to monitor his symptoms.   Today, 01/12/2015: He reports having more high BP values, in the 170s to 190s over high 90s to low 100s. He had some changes in his BP meds. He has been seeing a urologist, Dr. Alyson Ingles and tried Vesicare, but had side effects. Then he tried Myrbetriq, but because of blood pressure elevation he was told to stop it for now. His blood pressure medication has changed to Micardis. He has a follow-up appointment with his primary care physician soon. His wife is still worried about his depression. He would like to hold off on any medication changes until his blood pressure is under better control. She is looking into hiring a nursing aide for a couple of hours about 3 times a week to help with bathing, dressing, hygiene, and providing 1 meal a day. The patient is reluctant to have someone else in the house. His wife works full-time. She is worried about progression of his condition. He needs assistance with some of his ADLs. He uses a rolling walker but needs a new walker.   Previously:  I saw him on 01/08/2014, at which time he reported having had a sleep study in May 2015 and [place]. He reported that they were told he had no sleep apnea. He saw ENT and was treated with  antibiotics for an ear infection and an ear tube was placed. He reported worsening balance. He reported more nocturia. His wife felt that he was depressed. I made a referral to urology. I also encouraged him to discuss depression treatment with his primary care physician. His memory scores were stable. His MMSE was 26 out of 30 at the time.  I first met him on 07/08/2013 at the request of his primary care physician, at which time the patient reported forgetfulness and short-term memory issues. He also reported some swallowing issues. He was using a rolling walker and also had a scooter for longer distances and inside the house he was using a cane or was holding onto things. I suggested referral to home health occupational therapy. He was advised to no longer drive. I requested a brain MRI without contrast. He had this on 07/30/2013: Abnormal MRI brain (without) demonstrating: 1. Severe pontine and cerebellar atrophy. 2. Few punctate foci of periventricular and subcortical foci of non-specific gliosis. 3. Severe fluid/inflammation in the right mastoid air cells. Non-specific mild fluid and mucosal thickening in the sphenoid, ethmoid and maxillary sinuses. I also ordered a swallow study. He had this on 07/26/2013: This did not show any evidence of aspiration and he did not have any coughing or choking during the procedure. He was not advised to change his food or liquid consistency. General recommendations were given regarding aspiration precautions.   He was seen by  Dr. 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 He had a brain MRI in 03/1999, which apparently showed prominent cerebellar and brainstem atrophy. He has not had a swallow study. He does not have a FHx of cerebellar ataxia. His son has MS.   He has fallen, but not in the last 1 1/2 months and thankfully has not hurt himself. He tends to trip and catches himself. He has sleep issues, including  insomnia at night and EDS. He wakes with up with SOB, but does not have apneic pauses. In fact, he had a sleep study in [place] some 2 weeks ago, which per wife did not show any significant sleep disorder. He has control of his B/B function. He reports some depression and anxiety, but denies SI/HI, and has no hallucinations, no delusions, and no evidence of RBD. He sleeps poorly and is restless. He sleeps best between 5 AM and 8 AM. He denies RLS Sx and there is no report of PLMs. He still drives.   His Past Medical History Is Significant For: Past Medical History  Diagnosis Date  . Mixed hyperlipidemia   . Abnormality of gait     uses cane at home/  walker and or scooter for longer distance  . Unspecified essential hypertension   . Allergic rhinitis, cause unspecified   . 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 BPH (benign prostatic hypertrophy)   . Lower urinary tract symptoms (LUTS)   . Severe dysarthria     speech --  w/ mild hypophonic  . Memory loss     mild  . ED (erectile dysfunction)   . Depression   . Microscopic hematuria   . Sebaceous cyst     left inguinal  . Wears glasses   . Glaucoma of both eyes   . Claustrophobia     His Past Surgical History Is Significant For: Past Surgical History  Procedure Laterality Date  . No past surgeries    . Negative sleep study  April 2016--  per pt  . Scrotal exploration Left 12/26/2014    Procedure: LEFT INGUINAL SEBACEOUS CYST EXCISION;  Surgeon: Cleon Gustin, MD;  Location: [facility];  Service: Urology;  Laterality: Left;  . Cystoscopy N/A 12/26/2014    Procedure: CYSTOSCOPY;  Surgeon: Cleon Gustin, MD;  Location: [facility];  Service: Urology;  Laterality: N/A;    His Family History Is Significant For: Family History  Problem Relation Age of Onset  . Cancer  Mother   . Heart failure Father   . Multiple sclerosis Son   . Stroke Maternal Grandmother   . Heart failure Maternal Uncle     His Social History Is Significant For: Social History   Social History  . Marital Status: Married    Spouse Name: Hassan Rowan  . Number of Children: 4  . Years of Education: 12   Occupational History  .      retired   Social History Main Topics  . Smoking status: Never Smoker   . Smokeless tobacco: Never Used  . Alcohol Use: No  . Drug Use: No  . Sexual Activity: Not Asked   Other Topics Concern  . None   Social History Narrative   Patient is right handed and consumes no caffeine    His Allergies Are:  No Known  Allergies:   His Current Medications Are:  Outpatient Encounter Prescriptions as of 01/12/2015  Medication Sig  . aspirin 81 MG tablet Take 81 mg by mouth daily.  Marland Kitchen ezetimibe (ZETIA) 10 MG tablet Take 10 mg by mouth daily.  . fenofibrate (TRICOR) 145 MG tablet Take 145 mg by mouth daily.  Marland Kitchen telmisartan (MICARDIS) 80 MG tablet Take 80 mg by mouth every morning.  . Travoprost, BAK Free, (TRAVATAN) 0.004 % SOLN ophthalmic solution Place 1 drop into both eyes at bedtime.  . [DISCONTINUED] HYDROcodone-acetaminophen (NORCO) 5-325 MG tablet Take 1 tablet by mouth every 6 (six) hours as needed for moderate pain.  . [DISCONTINUED] sulfamethoxazole-trimethoprim (BACTRIM DS,SEPTRA DS) 800-160 MG tablet Take 1 tablet by mouth 2 (two) times daily.   No facility-administered encounter medications on file as of 01/12/2015.  :  Review of Systems:  Out of a complete 14 point review of systems, all are reviewed and negative with the exception of these symptoms as listed below:   Review of Systems  Cardiovascular:       Patient reports that his blood pressure has been running high lately   Psychiatric/Behavioral:       Wife reports some depression    Objective:  Neurologic Exam  Physical Exam Physical Examination:   Filed Vitals:   01/12/15  1145  BP: 161/96  Pulse: 86  Resp: 18   General Examination: The patient is a very pleasant 69 y.o. male in no acute distress.  HEENT: Normocephalic, atraumatic, pupils are equal, round and reactive to light and accommodation.  Extraocular tracking shows moderate saccadic breakdown without nystagmus noted. There is limitation to upper gaze and downgaze. There is mild decrease in eye blink rate. Hearing is intact. Face is symmetric with mild to moderate facial masking and normal facial sensation. There is no lip, neck or jaw tremor. Neck is mildly rigid with intact passive ROM. There are no carotid bruits on auscultation. Oropharynx exam reveals moderate mouth dryness. No significant airway crowding is noted. Mallampati is class III. Tongue protrudes centrally and palate elevates symmetrically. There is no drooling. Speech is moderately hypophonic and fairly significantly dysarthric.  Chest: is clear to auscultation without wheezing, rhonchi or crackles noted.  Heart: sounds are regular and normal without murmurs, rubs or gallops noted.   Abdomen: is soft, non-tender and non-distended with normal bowel sounds appreciated on auscultation.  Extremities: There is no pitting edema in the distal lower extremities bilaterally. Pedal pulses are intact.  Skin: is warm and dry with no trophic changes noted. Age-related changes are noted on the skin.   Musculoskeletal: exam reveals no obvious joint deformities, tenderness, joint swelling or erythema.  Neurologically:  Mental status: The patient is awake and alert, paying good attention. He is able to partially provide the history. His wife provides most of his history . He is oriented to: person, place, time/date, situation, day of week, month of year and year. His memory, attention, language and knowledge are mildly impaired. There is no aphasia, agnosia, apraxia or anomia. There is a mild degree of bradyphrenia. Speech is mildly hypophonic with severe  dysarthria noted and mild to moderate degree of scanning. Mood is mildly depressed appearing and affect is blunted.   On 07/08/13: MMSE score is 26/30. CDT is 3/4. AFT (Animal Fluency Test) score is 9.  On 01/08/2014: MMSE 26/30, CDT is 3/4, AFT 18/min. Geriatric depression score is 12 out of 15. On 07/09/2014: MMSE: 25/30, CDT: 3/4, AFT: 11/min. GDS: 12/15. On 01/12/2015:  MMSE: 25/30, CDT: 2/4, AFT: 14/min, GDS: 10/15.   Cranial nerves are as described above under HEENT exam. In addition, shoulder shrug is normal with equal shoulder height noted.  Motor exam: Normal bulk, and strength for age is noted. There are no dyskinesias noted.  Tone is not rigid with absence of cogwheeling in the extremities. There is overall moderate bradykinesia. There is no drift, but he has evidence of rebound bilaterally.  There is no tremor.   Romberg is not testable, as he cannot stand unsupported.   Reflexes are 1+ in the upper extremities and trace in the lower extremities. Fine motor skills exam: Finger taps are moderately impaired on the right and moderately impaired on the left. Hand movements are moderately impaired on the right and moderately impaired on the left. RAP (rapid alternating patting) is moderately impaired on the right and moderately impaired on the left. Foot taps are moderately impaired on the right and moderately impaired on the left. Foot agility (in the form of heel stomping) is moderately impaired on the right and moderately impaired on the left.    Cerebellar testing shows moderate dysmetria a mild intention tremor on finger to nose testing. Heel to shin is moderately impaired bilaterally. There is very mild truncal ataxia.   Sensory exam is intact to light touch in the upper and lower extremities.   Gait, station and balance: He stands up from the seated position with mild difficulty and does need to push up with His hands. He needs no assistance. No veering to one side is noted. He is not noted  to lean to the side. Posture is mildly stooped for age. Stance is wide-based. He walks with his rolling walker with an ataxic, wide-base gait and difficulty with turns, largely unchanged. Tandem walk is not tested d/t obvious balance impairment. He is not able to do a toe or heel stance.     Assessment and Plan:   In summary, Ryle Buscemi is a very pleasant 69 year old male with a history of a neurodegenerative condition, most likely in keeping with MSA-C, per history and exam.  He has had mild memory loss but numbers are stable. He has had depression but is not on any medication as yet. I suggested we start him on low-dose Cytomel Premarin but he would like to hold off until he sees his primary care physician and also his urologist. I suggested a reevaluation of things in 3 months. I will prescribe a new rolling walker. I will also refer him back to home health for nursing her nursing aide. He needs assistance in his ADLs at this time and his wife works full-time. We will monitor his memory scores. Blood pressure numbers have been elevated. He brought in a log today. The patient is reluctant to accept any help at home. Nevertheless, we mutually agreed to make a referral to home health. In addition, we will reevaluate the need for an antidepressant next time. He has had bladder hyperactivity and incontinence. He may need a permanent catheter placed. I will see him back in 3 months, sooner if needed.  I answered all their questions today and the patient and his wife were in agreement with the above outlined plan. I would like to see the patient back in 6 months, sooner if the need arises and encouraged them to call with any interim questions, concerns, problems or updates.  Most of my 25 minute visit today was spent in counseling and coordination of care, reviewing test results  and reviewing medication and the Dx of memory loss, depression, and MSA, the prognosis and treatment options.

## 2015-01-12 NOTE — Patient Instructions (Addendum)
We will monitor your memory and depression. We will revisit an antidepressant in about 3 months.   Use your walker at all times and I will put in a prescription for another walker.   We will do home health referral.

## 2015-01-15 ENCOUNTER — Telehealth: Payer: Self-pay

## 2015-01-15 NOTE — Telephone Encounter (Signed)
I spoke to [place] Tappan[place]est and gave him 2 contacts for help around the house (nonmedical help). I gave him the number for [facility] and [facility] [employer].

## 2015-02-24 DIAGNOSIS — L439 Lichen planus, unspecified: Secondary | ICD-10-CM | POA: Diagnosis not present

## 2015-02-24 DIAGNOSIS — G3281 Cerebellar ataxia in diseases classified elsewhere: Secondary | ICD-10-CM | POA: Diagnosis not present

## 2015-02-24 DIAGNOSIS — H2513 Age-related nuclear cataract, bilateral: Secondary | ICD-10-CM | POA: Diagnosis not present

## 2015-02-24 DIAGNOSIS — H401233 Low-tension glaucoma, bilateral, severe stage: Secondary | ICD-10-CM | POA: Diagnosis not present

## 2015-03-03 DIAGNOSIS — L438 Other lichen planus: Secondary | ICD-10-CM | POA: Diagnosis not present

## 2015-03-24 DIAGNOSIS — I10 Essential (primary) hypertension: Secondary | ICD-10-CM | POA: Diagnosis not present

## 2015-04-14 ENCOUNTER — Ambulatory Visit: Payer: Medicare Other | Admitting: Neurology

## 2015-04-15 ENCOUNTER — Ambulatory Visit: Payer: Medicare Other | Admitting: Neurology

## 2015-04-28 DIAGNOSIS — I10 Essential (primary) hypertension: Secondary | ICD-10-CM | POA: Diagnosis not present

## 2015-06-03 ENCOUNTER — Ambulatory Visit: Payer: Medicare Other | Admitting: Neurology

## 2015-10-01 DIAGNOSIS — R2681 Unsteadiness on feet: Secondary | ICD-10-CM | POA: Diagnosis not present

## 2015-10-01 DIAGNOSIS — G238 Other specified degenerative diseases of basal ganglia: Secondary | ICD-10-CM | POA: Diagnosis not present

## 2015-10-01 DIAGNOSIS — R2689 Other abnormalities of gait and mobility: Secondary | ICD-10-CM | POA: Diagnosis not present

## 2015-11-11 DIAGNOSIS — K5520 Angiodysplasia of colon without hemorrhage: Secondary | ICD-10-CM | POA: Diagnosis not present

## 2015-11-11 DIAGNOSIS — Z86010 Personal history of colonic polyps: Secondary | ICD-10-CM | POA: Diagnosis not present

## 2016-03-08 DIAGNOSIS — I10 Essential (primary) hypertension: Secondary | ICD-10-CM | POA: Diagnosis not present

## 2016-03-08 DIAGNOSIS — E7800 Pure hypercholesterolemia, unspecified: Secondary | ICD-10-CM | POA: Diagnosis not present

## 2016-03-08 DIAGNOSIS — E118 Type 2 diabetes mellitus with unspecified complications: Secondary | ICD-10-CM | POA: Diagnosis not present

## 2016-03-08 DIAGNOSIS — Z125 Encounter for screening for malignant neoplasm of prostate: Secondary | ICD-10-CM | POA: Diagnosis not present

## 2016-07-19 DIAGNOSIS — R0600 Dyspnea, unspecified: Secondary | ICD-10-CM | POA: Diagnosis not present

## 2016-07-19 DIAGNOSIS — I10 Essential (primary) hypertension: Secondary | ICD-10-CM | POA: Diagnosis not present

## 2016-07-19 DIAGNOSIS — R600 Localized edema: Secondary | ICD-10-CM | POA: Diagnosis not present

## 2016-07-19 DIAGNOSIS — G4720 Circadian rhythm sleep disorder, unspecified type: Secondary | ICD-10-CM | POA: Diagnosis not present

## 2016-07-19 DIAGNOSIS — E118 Type 2 diabetes mellitus with unspecified complications: Secondary | ICD-10-CM | POA: Diagnosis not present

## 2016-07-25 DIAGNOSIS — R0600 Dyspnea, unspecified: Secondary | ICD-10-CM | POA: Diagnosis not present

## 2016-07-25 DIAGNOSIS — I10 Essential (primary) hypertension: Secondary | ICD-10-CM | POA: Diagnosis not present

## 2016-08-17 DIAGNOSIS — R600 Localized edema: Secondary | ICD-10-CM | POA: Diagnosis not present

## 2016-08-17 DIAGNOSIS — I10 Essential (primary) hypertension: Secondary | ICD-10-CM | POA: Diagnosis not present

## 2016-08-17 DIAGNOSIS — G4720 Circadian rhythm sleep disorder, unspecified type: Secondary | ICD-10-CM | POA: Diagnosis not present

## 2016-10-19 DIAGNOSIS — R062 Wheezing: Secondary | ICD-10-CM | POA: Diagnosis not present

## 2016-10-19 DIAGNOSIS — G238 Other specified degenerative diseases of basal ganglia: Secondary | ICD-10-CM | POA: Diagnosis not present

## 2016-10-19 DIAGNOSIS — R0602 Shortness of breath: Secondary | ICD-10-CM | POA: Diagnosis not present

## 2016-10-27 DIAGNOSIS — R0602 Shortness of breath: Secondary | ICD-10-CM | POA: Diagnosis not present

## 2016-10-27 DIAGNOSIS — G238 Other specified degenerative diseases of basal ganglia: Secondary | ICD-10-CM | POA: Diagnosis not present

## 2016-11-03 ENCOUNTER — Telehealth: Payer: Self-pay

## 2016-11-03 DIAGNOSIS — R942 Abnormal results of pulmonary function studies: Secondary | ICD-10-CM | POA: Diagnosis not present

## 2016-11-03 DIAGNOSIS — R0602 Shortness of breath: Secondary | ICD-10-CM | POA: Diagnosis not present

## 2016-11-03 DIAGNOSIS — G709 Myoneural disorder, unspecified: Secondary | ICD-10-CM | POA: Diagnosis not present

## 2016-11-03 DIAGNOSIS — I10 Essential (primary) hypertension: Secondary | ICD-10-CM | POA: Diagnosis not present

## 2016-11-03 NOTE — Telephone Encounter (Signed)
Unfortunately, I was not able to call Dr. Orma FlamingBeauford's office before 5 and got done in clinic at 5:22 PM. At that moment my laptop shut down and I could not restart it until four attempts of rebooting till 5:55 PM. Baxter HireKristen, since I am also off on Tuesday, would you do me a favor and call again on Tuesday to speak with office staff to find out what concerns I can address on Wednesday when I'm back? If the main concern is follow-up, I would be happy to see him anytime and please arrange for follow-up appointment at the time. If unable to call Dr. Orma FlamingBeauford's office, can you call the patient and find out how we can help? It may just be a matter of making a follow-up appointment, which should not be a problem. I have not seen him in nearly 2 years.

## 2016-11-03 NOTE — Telephone Encounter (Signed)
Received a call from Dr. Orma FlamingBeauford's office on this pt, I was skyped and took the call. Carollee HerterShannon at this office asked to speak with Dr. Frances FurbishAthar regarding this pt, I offered to take a detailed message to give to Dr. Frances FurbishAthar since she is with patients this morning, but Carollee HerterShannon refused, said that Dr. Eulis FosterBeauford wants to speak with Dr. Frances FurbishAthar only, and wants Dr. Frances FurbishAthar to call [phone] during business hours.  Of note, Dr. Frances FurbishAthar has not seen this pt since 01/12/2015 and I informed Carollee HerterShannon of this. Pt cancelled appts on 04/14/15, 04/15/15, and on 06/03/2015.

## 2016-11-08 NOTE — Telephone Encounter (Signed)
I called Dr. Beauford's office, spoke to MeadowKelly. She reports that Dr.Orma Flaming Eulis FosterBeauford just needs this pt seen again by neurology and does not need to speak with Dr. Frances FurbishAthar unless she has further questions for him.   I called pt, spoke to pt and pt's wife Steward DroneBrenda, per DPR. I offered pt an appt on 11/10/16 with Dr. Frances FurbishAthar at 2:30pm. Pt and wife are agreeable to this and know to arrive at 2:15 for the 2:30pm appt .

## 2016-11-10 ENCOUNTER — Ambulatory Visit (INDEPENDENT_AMBULATORY_CARE_PROVIDER_SITE_OTHER): Payer: Medicare HMO | Admitting: Neurology

## 2016-11-10 ENCOUNTER — Encounter: Payer: Self-pay | Admitting: Neurology

## 2016-11-10 VITALS — BP 153/104 | HR 98 | Ht 71.0 in | Wt 209.0 lb

## 2016-11-10 DIAGNOSIS — G238 Other specified degenerative diseases of basal ganglia: Secondary | ICD-10-CM | POA: Diagnosis not present

## 2016-11-10 NOTE — Progress Notes (Signed)
Subjective:    Patient ID: Donald Stokes is a 71 y.o. male.  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 He is accompanied by his wife again today and presents after a long gap of nearly 2 years. He canceled a couple of interim appointments. I last saw him on 01/12/2015, at which time he reported having more high blood pressure values. He had seen a urologist recently and was tried on [place], but had side effects. He then tried Myrbetriq, but because of blood pressure elevation he was told to stop the medication. He had some medication changes for his blood pressure control. His wife was worried about his depressed mood. His wife was looking into getting some help at the house as he needed help with his ADLs including bathing, but dressing, hygiene. She was still working full-time and needed help at the house. He was using a rolling walker but needed a new walker. His MMSE was 25/30, CDT: 2/4, AFT: 14/min, GDS: 10/15 at the time. He wanted to avoid any new medications at the time because he recently had blood pressure medication changes and wanted to wait things out more. I made a referral to home health and also put in a prescription for a new walker.  Today, 11/10/2016 (all dictated new, as well as above notes, some dictation done in note pad or Word, outside of chart, may appear as copied):  He reports having shortness of breath when lying down. His wife has also noticed in the past several months swelling around his legs and ankles. Of note, he has been on amlodipine 10 mg strength. For his shortness of breath he was seen recently by pulmonologist Dr. Verdie Mosher. I do not have any records available to review from that  appointment. Per wife, patient had a chest x-ray and was told that lung function was fine. Dr. Shelia Media started him on Lasix which actually helps but patient does not like to take it because it causes urinary frequency. Patient has difficulty falling asleep and staying asleep. He was given generic Remeron for sleep but he felt it made him feel groggy and did not actually help for sleep. He does not currently take it. He is relying more on his walker. He had a few recent falls but thankfully no injuries. His wife is noted a tendency to cough when he eats and tries to talk at the same time.   The patient's allergies, current medications, family history, past medical history, past social history, past surgical history and problem list were reviewed and updated as appropriate.   Previously (copied from previous notes for reference):   I saw him on 07/09/2014 at which time he reported feeling fairly stable. He had nocturia about 3 times on an average night. He had a recent change in his blood pressure medication and no longer on amlodipine and he was on Hyzaar twice daily. His memory scores were stable. We agreed to monitor his symptoms.    I saw him on 01/08/2014, at which time he reported having had a sleep study in May 2015 and [place]. He reported that they were told he had no sleep apnea. He saw ENT and was treated with antibiotics for an ear infection and an ear tube was  placed. He reported worsening balance. He reported more nocturia. His wife felt that he was depressed. I made a referral to urology. I also encouraged him to discuss depression treatment with his primary care physician. His memory scores were stable. His MMSE was 26 out of 30 at the time.   I first met him on 07/08/2013 at the request of his primary care physician, at which time the patient reported forgetfulness and short-term memory issues. He also reported some swallowing issues. He was using a rolling walker and also had a scooter  for longer distances and inside the house he was using a cane or was holding onto things. I suggested referral to home health occupational therapy. He was advised to no longer drive. I requested a brain MRI without contrast. He had this on 07/30/2013: Abnormal MRI brain (without) demonstrating: 1. Severe pontine and cerebellar atrophy. 2. Few punctate foci of periventricular and subcortical foci of non-specific gliosis. 3. Severe fluid/inflammation in the right mastoid air cells. Non-specific mild fluid and mucosal thickening in the sphenoid, ethmoid and maxillary sinuses. I also ordered a swallow study. He had this on 07/26/2013: This did not show any evidence of aspiration and he did not have any coughing or choking during the procedure. He was not advised to change his food or liquid consistency. General recommendations were given regarding aspiration precautions.     He was seen by Dr. 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 He had a brain MRI in 03/1999, which apparently showed prominent cerebellar and brainstem atrophy. He has not had a swallow study. He does not have a FHx of cerebellar ataxia. His son has MS.   He has fallen, but not in the last 1 1/2 months and thankfully has not hurt himself. He tends to trip and catches himself. He has sleep issues, including insomnia at night and EDS. He wakes with up with SOB, but does not have apneic pauses. In fact, he had a sleep study in [place] some 2 weeks ago, which per wife did not show any significant sleep disorder. He has control of his B/B function. He reports some depression and anxiety, but denies SI/HI, and has no hallucinations, no delusions, and no evidence of RBD. He sleeps poorly and is restless. He sleeps best between 5 AM and 8 AM. He denies RLS Sx and there is no report of PLMs. He still drives.   His Past Medical History Is Significant For: Past Medical History:  Diagnosis  Date  . Abnormality of gait    uses cane at home/  walker and or scooter for longer distance  . Allergic rhinitis, cause unspecified   . BPH (benign prostatic hypertrophy)   . Claustrophobia   . Depression   . ED (erectile dysfunction)   . Glaucoma of both eyes   . Lower urinary tract symptoms (LUTS)   . Memory loss    mild  . Microscopic hematuria   . Mixed hyperlipidemia   . 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 Sebaceous cyst    left inguinal  . Severe dysarthria    speech --  w/ mild hypophonic  . Unspecified essential hypertension   . Wears glasses     His Past Surgical History Is Significant For: Past Surgical History:  Procedure Laterality Date  . CYSTOSCOPY N/A 12/26/2014   Procedure:  CYSTOSCOPY;  Surgeon: Cleon Gustin, MD;  Location: [facility];  Service: Urology;  Laterality: N/A;  . NEGATIVE SLEEP STUDY  April 2016--  per pt  . NO PAST SURGERIES    . SCROTAL EXPLORATION Left 12/26/2014   Procedure: LEFT INGUINAL SEBACEOUS CYST EXCISION;  Surgeon: Cleon Gustin, MD;  Location: [facility];  Service: Urology;  Laterality: Left;    His Family History Is Significant For: Family History  Problem Relation Age of Onset  . Cancer Mother   . Heart failure Father   . Multiple sclerosis Son   . Stroke Maternal Grandmother   . Heart failure Maternal Uncle     His Social History Is Significant For: Social History   Social History  . Marital status: Married    Spouse name: Hassan Rowan  . Number of children: 4  . Years of education: 12   Occupational History  .      retired   Social History Main Topics  . Smoking status: Never Smoker  . Smokeless tobacco: Never Used  . Alcohol use No  . Drug use: No  . Sexual activity: Not Asked   Other Topics Concern  . None   Social History Narrative   Patient is  right handed and consumes no caffeine    His Allergies Are:  No Known Allergies:   His Current Medications Are:  Outpatient Encounter Prescriptions as of 11/10/2016  Medication Sig  . amLODipine (NORVASC) 10 MG tablet Take 10 mg by mouth daily.  Marland Kitchen aspirin 81 MG tablet Take 81 mg by mouth daily.  Marland Kitchen ezetimibe (ZETIA) 10 MG tablet Take 10 mg by mouth daily.  . fenofibrate (TRICOR) 145 MG tablet Take 145 mg by mouth daily.  . furosemide (LASIX) 20 MG tablet Take 20 mg by mouth every morning.  . Travoprost, BAK Free, (TRAVATAN) 0.004 % SOLN ophthalmic solution Place 1 drop into both eyes at bedtime.  . [DISCONTINUED] telmisartan (MICARDIS) 80 MG tablet Take 80 mg by mouth every morning.   No facility-administered encounter medications on file as of 11/10/2016.   :  Review of Systems:  Out of a complete 14 point review of systems, all are reviewed and negative with the exception of these symptoms as listed below: Review of Systems  Neurological:       Pt presents today to discuss his shortness of breath, trouble sleeping, and frequent urination. Pt says that Dr. Verdie Mosher requested that he see Dr. Rexene Alberts for these issues.    Objective:  Neurological Exam  Physical Exam Physical Examination:   Vitals:   11/10/16 1449  BP: (!) 153/104  Pulse: 98   General Examination: The patient is a very pleasant 71 y.o. male in no acute distress. He appears well-developed and well-nourished and well groomed.   HEENT: Normocephalic, atraumatic, pupils are equal, round and reactive to light and accommodation.  Extraocular tracking shows moderate saccadic breakdown without nystagmus noted. There is limitation to upper gaze and downgaze. There is mild decrease in eye blink rate. Hearing is intact. Face is symmetric with mild to moderate facial masking and normal facial sensation. There is no lip, neck or jaw tremor. Neck is mildly rigid with intact passive ROM. Oropharynx exam revealsMild to moderate mouth  dryness. Speech is moderately hypophonic and moderately dysarthric. He has no sialorrhea. Tongue movements are slightly slow but otherwise symmetrical.  Chest: is clear to auscultation without wheezing, rhonchi or crackles noted.  Heart: sounds are regular and normal  without murmurs, rubs or gallops noted.   Abdomen: is soft, non-tender and non-distended with normal bowel sounds appreciated on auscultation.  Extremities: There is  1+ pitting edema in the distal lower extremities, particularly around both ankles.   Skin: is warm and dry with no trophic changes noted.   Musculoskeletal: exam reveals no obvious joint deformities, tenderness, joint swelling or erythema.  Neurologically:  Mental status: The patient is awake and alert, paying good attention. He is able to partially provide the history. His wife provides most of his history. He is oriented to: person, place, time/date, situation, day of week, month of year and year. His memory, attention, language and knowledge are mildly impaired, stable. There is no aphasia, agnosia, apraxia or anomia. There is a mild degree of bradyphrenia. Speech is hypophonic with dysarthria noted and mild to moderate degree of scanning. Mood is normal and affect is normal.   On 07/08/13: MMSE score is 26/30. CDT is 3/4. AFT (Animal Fluency Test) score is 9.  On 01/08/2014: MMSE 26/30, CDT is 3/4, AFT 18/min. Geriatric depression score is 12 out of 15. On 07/09/2014: MMSE: 25/30, CDT: 3/4, AFT: 11/min. GDS: 12/15. On 01/12/2015: MMSE: 25/30, CDT: 2/4, AFT: 14/min, GDS: 10/15.  On 11/10/2016: MMSE: 25/30, CDT: 2/4, AFT: 12/min.   Cranial nerves are as described above under HEENT exam. In addition, shoulder shrug is normal with equal shoulder height noted.  Motor exam: Normal bulk, and strength for age is noted. There are no dyskinesias noted.  Tone is not rigid with absence of cogwheeling in the extremities. There is overall moderate bradykinesia. There is  no drift, but he has evidence of rebound bilaterally.  There is no tremor.   Romberg is not testable, as he cannot stand unsupported.   Reflexes are 1+ in the upper extremities and trace in the lower extremities. Fine motor skills exam:  fine motor skills are moderately impaired, cerebellar testing shows dysmetria.   Sensory exam is intact to light touch in the upper and lower extremities.   Gait, station and balance: He stands up from the seated position with  moderate difficulty and needs to push himself up. He stands wide-based. He relies on his rolling walker. Balance is impaired.   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 He has had mild memory loss but numbers are stable. He has had depression but  this is not a serious problem at this time. He has had some problems swallowing, had a swallow study in the past, has some coughing when trying to talk and eat at the same time. We talked about the importance of eating upright and with smaller bites, multiple dry swallows in between and cannot multitask when eating. I'm not quite sure how to explain his shortness of breath when lying down. I did advise the patient that amlodipine can cause leg and ankle swelling. He is advised to talk to his primary care physician about potentially seeing a cardiologist. A few years ago his wife reports he was in the emergency room because of shortness of breath and had seen a cardiologist at the time. I could not pull up in the emergency room records in our system. I could not pull up any echocardiogram in our system. He had a recent checkup with pulmonology and as far as lung function and lung exam goes he was  told he was fine. For his sleep difficulties I suggested trial of melatonin which is over-the-counter. Furthermore, it may be worth trying over-the-counter  Pepcid for a couple of weeks to see if his symptoms while lying down improve. Nevertheless, I think it may be worthwhile to have him see cardiology. From my end of things I suggested as needed follow-up. I answered all the questions today and the patient and his wife were in agreement.

## 2016-11-10 NOTE — Patient Instructions (Addendum)
I am not sure how to explain your shortness of breath with lying down.  Amlodipine, on the other hand can cause swelling of the legs/ankles.  You are at risk for aspiration, getting food down the wrong pipe. Please eat at a slow rate, small sips and bites, multiple dry swallows after each bite or sip, follow solids with liquid to help food get down, clear throat intermittently. Remain semi-upright after meals, sit upright at 90 degrees for eating and drinking. Furthermore, I recommend: no "multitasking" while eating, as in, no talking, reading, playing, watching TV. Focus on the task of eating or drinking only.  Please talk to Dr. Renne CriglerPharr about seeing a cardiologist.   You can try Melatonin at night for sleep: take 1 mg to 3 mg, one to 2 hours before your bedtime. You can go up to 5 mg if needed. It is over the counter and comes in pill form, chewable form and spray, if you prefer.   Try over the counter Pepcid/famotidine for a couple of weeks and see if you sleep better lying down.

## 2016-12-19 DIAGNOSIS — H2513 Age-related nuclear cataract, bilateral: Secondary | ICD-10-CM | POA: Diagnosis not present

## 2016-12-19 DIAGNOSIS — E119 Type 2 diabetes mellitus without complications: Secondary | ICD-10-CM | POA: Diagnosis not present

## 2016-12-19 DIAGNOSIS — H401132 Primary open-angle glaucoma, bilateral, moderate stage: Secondary | ICD-10-CM | POA: Diagnosis not present

## 2016-12-19 DIAGNOSIS — G3281 Cerebellar ataxia in diseases classified elsewhere: Secondary | ICD-10-CM | POA: Diagnosis not present

## 2017-01-03 DIAGNOSIS — I10 Essential (primary) hypertension: Secondary | ICD-10-CM | POA: Diagnosis not present

## 2017-01-03 DIAGNOSIS — Z23 Encounter for immunization: Secondary | ICD-10-CM | POA: Diagnosis not present

## 2017-01-10 DIAGNOSIS — I10 Essential (primary) hypertension: Secondary | ICD-10-CM | POA: Diagnosis not present

## 2017-01-12 ENCOUNTER — Emergency Department (HOSPITAL_COMMUNITY)
Admission: EM | Admit: 2017-01-12 | Discharge: 2017-01-12 | Disposition: A | Discharge status: Home / Self Care | Payer: Medicare HMO | Attending: Emergency Medicine | Admitting: Emergency Medicine

## 2017-01-12 ENCOUNTER — Encounter (HOSPITAL_COMMUNITY): Payer: Self-pay

## 2017-01-12 ENCOUNTER — Emergency Department (HOSPITAL_COMMUNITY): Payer: Medicare HMO

## 2017-01-12 DIAGNOSIS — Z79899 Other long term (current) drug therapy: Secondary | ICD-10-CM | POA: Diagnosis not present

## 2017-01-12 DIAGNOSIS — R2242 Localized swelling, mass and lump, left lower limb: Secondary | ICD-10-CM | POA: Diagnosis not present

## 2017-01-12 DIAGNOSIS — Z7982 Long term (current) use of aspirin: Secondary | ICD-10-CM | POA: Diagnosis not present

## 2017-01-12 DIAGNOSIS — R600 Localized edema: Secondary | ICD-10-CM | POA: Diagnosis not present

## 2017-01-12 DIAGNOSIS — R609 Edema, unspecified: Secondary | ICD-10-CM

## 2017-01-12 DIAGNOSIS — I10 Essential (primary) hypertension: Secondary | ICD-10-CM | POA: Diagnosis not present

## 2017-01-12 DIAGNOSIS — R0602 Shortness of breath: Secondary | ICD-10-CM | POA: Diagnosis not present

## 2017-01-12 DIAGNOSIS — R0601 Orthopnea: Secondary | ICD-10-CM | POA: Insufficient documentation

## 2017-01-12 DIAGNOSIS — R2241 Localized swelling, mass and lump, right lower limb: Secondary | ICD-10-CM | POA: Insufficient documentation

## 2017-01-12 LAB — CBC WITH DIFFERENTIAL/PLATELET
BASOS ABS: 0 10*3/uL (ref 0.0–0.1)
BASOS PCT: 0 %
EOS PCT: 5 %
Eosinophils Absolute: 0.4 10*3/uL (ref 0.0–0.7)
HCT: 42.8 % (ref 39.0–52.0)
Hemoglobin: 14.4 g/dL (ref 13.0–17.0)
Lymphocytes Relative: 29 %
Lymphs Abs: 2.2 10*3/uL (ref 0.7–4.0)
MCH: 28.6 pg (ref 26.0–34.0)
MCHC: 33.6 g/dL (ref 30.0–36.0)
MCV: 84.9 fL (ref 78.0–100.0)
MONO ABS: 0.6 10*3/uL (ref 0.1–1.0)
Monocytes Relative: 8 %
Neutro Abs: 4.3 10*3/uL (ref 1.7–7.7)
Neutrophils Relative %: 58 %
Platelets: 258 10*3/uL (ref 150–400)
RBC: 5.04 MIL/uL (ref 4.22–5.81)
RDW: 15.4 % (ref 11.5–15.5)
WBC: 7.5 10*3/uL (ref 4.0–10.5)

## 2017-01-12 LAB — COMPREHENSIVE METABOLIC PANEL
ALBUMIN: 4.3 g/dL (ref 3.5–5.0)
ALT: 50 U/L (ref 17–63)
AST: 52 U/L — AB (ref 15–41)
Alkaline Phosphatase: 48 U/L (ref 38–126)
Anion gap: 6 (ref 5–15)
BUN: 16 mg/dL (ref 6–20)
CALCIUM: 9.5 mg/dL (ref 8.9–10.3)
CO2: 29 mmol/L (ref 22–32)
CREATININE: 1.01 mg/dL (ref 0.61–1.24)
Chloride: 104 mmol/L (ref 101–111)
GFR calc Af Amer: >60 mL/min (ref >60)
GLUCOSE: 123 mg/dL — AB (ref 65–99)
POTASSIUM: 5.8 mmol/L — AB (ref 3.5–5.1)
Sodium: 139 mmol/L (ref 135–145)
TOTAL PROTEIN: 8 g/dL (ref 6.5–8.1)
Total Bilirubin: 1.1 mg/dL (ref 0.3–1.2)

## 2017-01-12 LAB — BRAIN NATRIURETIC PEPTIDE: B Natriuretic Peptide: 32.5 pg/mL (ref 0.0–100.0)

## 2017-01-12 LAB — TROPONIN I

## 2017-01-12 MED ORDER — FUROSEMIDE 20 MG PO TABS: 20.0000 mg | ORAL_TABLET | Freq: Two times a day (BID) | ORAL | 0 refills | Status: AC

## 2017-01-12 MED ORDER — FUROSEMIDE 10 MG/ML IJ SOLN
20.0000 mg | Freq: Once | INTRAMUSCULAR | Status: AC
  Administered 2017-01-12: 20 mg via INTRAVENOUS
  Filled 2017-01-12: qty 4

## 2017-01-12 NOTE — Progress Notes (Signed)
NIF -40 good pt effort.

## 2017-01-12 NOTE — ED Triage Notes (Signed)
Pt complains of being short of breath and bilateral extremity swelling for one week Pt hasn't been able to sleep and can't lay down

## 2017-01-12 NOTE — ED Notes (Signed)
Ambulated pt. In hall with walker.

## 2017-01-12 NOTE — ED Notes (Signed)
Angie from respiratory en route to preform NIF.

## 2017-01-12 NOTE — ED Provider Notes (Signed)
[facility]-EMERGENCY DEPT Provider Note   CSN: [id number] Arrival date & time: 01/12/17  [id number]     History   Chief Complaint Chief Complaint  Patient presents with  . Shortness of Breath    HPI Gwenith DailyBernest Denunzio is a 71 y.o. male.  HPI  71 year old male with past medical history as below here with shortness of breath.  The patient and wife state that over the last several weeks, the patient has had increasing bilateral lower extremity edema.  He has had associated increasing orthopnea as well as shortness of breath with exertion.  He is unable to lay flat and is sleeping in a recliner.  He denies any chest pain.  He has a history of heart failure in the past and is currently on Lasix.  He continues to take the Lasix as prescribed.  He does state he is gained some weight over the last several days as well, but he is unsure of how much.  Denies any abdominal pain, nausea, vomiting, or abdominal distention.  Denies any dysuria or hematuria.  Denies any other medical complaints at this time.  No recent medication changes.  His blood pressure has been elevated at home.  Past Medical History:  Diagnosis Date  . Abnormality of gait    uses cane at home/  walker and or scooter for longer distance  . Allergic rhinitis, cause unspecified   . BPH (benign prostatic hypertrophy)   . Claustrophobia   . Depression   . ED (erectile dysfunction)   . Glaucoma of both eyes   . Lower urinary tract symptoms (LUTS)   . Memory loss    mild  . Microscopic hematuria   . Mixed hyperlipidemia   . 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 Sebaceous cyst    left inguinal  . Severe dysarthria    speech --  w/ mild hypophonic  . Unspecified essential hypertension   . Wears glasses     Patient Active Problem List   Diagnosis Date Noted  . Multiple system atrophy C  ([facility]) 07/08/2013    Past Surgical History:  Procedure Laterality Date  . NEGATIVE SLEEP STUDY  April 2016--  per pt  . NO PAST SURGERIES         Home Medications    Prior to Admission medications   Medication Sig Start Date End Date Taking? Authorizing Provider  amLODipine (NORVASC) 10 MG tablet Take 10 mg by mouth daily. 11/04/16  Yes [provider]  aspirin 81 MG tablet Take 81 mg by mouth daily.   Yes [provider]  Chlorphen-Phenyleph-APAP (CORICIDIN D COLD/FLU/SINUS) 2-5-325 MG TABS Take 1 tablet every 8 (eight) hours as needed by mouth (sinus congestion).   Yes [provider]  ezetimibe (ZETIA) 10 MG tablet Take 10 mg by mouth daily.   Yes [provider]  fenofibrate (TRICOR) 145 MG tablet Take 145 mg by mouth daily.   Yes [provider]  irbesartan (AVAPRO) 300 MG tablet Take 300 mg daily by mouth. 01/03/17  Yes [provider]  Travoprost, BAK Free, (TRAVATAN) 0.004 % SOLN ophthalmic solution Place 1 drop into both eyes at bedtime.   Yes [provider]  furosemide (LASIX) 20 MG tablet Take 1 tablet (20 mg total) 2 (two) times daily for 5 days by mouth. 01/12/17 01/17/17  Shaune PollackIsaacs, Micheal Sheen, MD    Family History Family  History  Problem Relation Age of Onset  . Cancer Mother   . Heart failure Father   . Multiple sclerosis Son   . Stroke Maternal Grandmother   . Heart failure Maternal Uncle     Social History Social History   Tobacco Use  . Smoking status: Never Smoker  . Smokeless tobacco: Never Used  Substance Use Topics  . Alcohol use: No    Alcohol/week: 0.0 oz  . Drug use: No     Allergies   Patient has no known allergies.   Review of Systems Review of Systems  Constitutional: Positive for fatigue.  Respiratory: Positive for shortness of breath.   Cardiovascular: Positive for leg swelling.  Neurological: Positive for weakness.  All other systems reviewed and are  negative.    Physical Exam Updated Vital Signs BP (!) 137/95   Pulse 74   Temp 98.6 F (37 C) (Oral)   Resp 18   SpO2 96%   Physical Exam  Constitutional: He is oriented to person, place, and time. He appears well-developed and well-nourished. No distress.  HENT:  Head: Normocephalic and atraumatic.  Eyes: Conjunctivae are normal.  Neck: Neck supple. JVD present.  Cardiovascular: Normal rate, regular rhythm and normal heart sounds. Exam reveals no friction rub.  No murmur heard. Pulmonary/Chest: Effort normal. No respiratory distress. He has no wheezes. He has rales (bibasilar).  Abdominal: He exhibits no distension.  Musculoskeletal:       Right lower leg: He exhibits edema (1+ pitting b/l LE).  Neurological: He is alert and oriented to person, place, and time. He exhibits normal muscle tone.  Skin: Skin is warm. Capillary refill takes less than 2 seconds.  Psychiatric: He has a normal mood and affect.  Nursing note and vitals reviewed.    ED Treatments / Results  Labs (all labs ordered are listed, but only abnormal results are displayed) Labs Reviewed  COMPREHENSIVE METABOLIC PANEL - Abnormal; Notable for the following components:      Result Value   Potassium 5.8 (*)    Glucose, Bld 123 (*)    AST 52 (*)    All other components within normal limits  CBC WITH DIFFERENTIAL/PLATELET  BRAIN NATRIURETIC PEPTIDE  TROPONIN I    EKG  EKG Interpretation  Date/Time:  Thursday January 12 2017 08:05:54 EST Ventricular Rate:  82 PR Interval:    QRS Duration: 99 QT Interval:  401 QTC Calculation: 469 R Axis:   7 Text Interpretation:  Sinus rhythm Sinus pause Probable left atrial enlargement Left ventricular hypertrophy Artifact in lead(s) I II III aVR aVL aVF V1 No significant change since last tracing Confirmed by Shaune PollackIsaacs, Darlina Mccaughey [phone]([id number]) on 01/12/2017 9:38:08 AM       Radiology Dg Chest 2 View  Result Date: 01/12/2017 CLINICAL DATA:  71 year old male with  shortness of breath and bilateral extremity swelling for 1 week. EXAM: CHEST  2 VIEW COMPARISON:  10/19/2014 FINDINGS: The heart size and mediastinal contours are unchanged and within normal limits. There is bibasilar scarring and mild atelectasis. Both lungs are clear without focal opacity, pleural effusions or pneumothorax. The visualized skeletal structures are unremarkable. IMPRESSION: 1. Bibasilar scarring and atelectasis. 2. No active cardiopulmonary disease. Electronically Signed   By: Sande BrothersSerena  Chacko M.D.   On: 01/12/2017 08:51    Procedures Procedures (including critical care time)  Medications Ordered in ED Medications  furosemide (LASIX) injection 20 mg (20 mg Intravenous Given 01/12/17 0903)     Initial Impression / Assessment and Plan /  ED Course  I have reviewed the triage vital signs and the nursing notes.  Pertinent labs & imaging results that were available during my care of the patient were reviewed by me and considered in my medical decision making (see chart for details).     71 yo M with history as above here with worsening orthopnea, LE edema, and weight gain. 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 He his hypervolemic on exam but lungs clear, CXR w/o overt edema. Lab work is overall reassuring. He has minimal hyperkalemia - no EKG changes. He denies any CP at rest or with exertion, has neg trop - doubt ACS or anginal equivalent. He does have a h/o MSA but has strong NIF here - doubt neurogenic component. Pt given lasix here. Discussed management options - given that his sx are highly c/w orthopnea/fluid overload and correlate with his fluid gain, will trial increased lasix dose x 5 days. This may help his hyperK as well. Will have him f/u with PCP this week for repeat labs and exam. He does not want admission and feel outpt management is reasonable at this time. Return precautions given.  Final Clinical Impressions(s) / ED Diagnoses   Final diagnoses:   Edema, unspecified type  Orthopnea    ED Discharge Orders        Ordered    furosemide (LASIX) 20 MG tablet  2 times daily     01/12/17 1157       Shaune Pollack, MD 01/12/17 1940

## 2017-01-12 NOTE — ED Notes (Signed)
Bed: WA17 Expected date:  Expected time:  Means of arrival:  Comments: Room 24

## 2017-01-24 DIAGNOSIS — I10 Essential (primary) hypertension: Secondary | ICD-10-CM | POA: Diagnosis not present

## 2017-01-24 DIAGNOSIS — M79604 Pain in right leg: Secondary | ICD-10-CM | POA: Diagnosis not present

## 2017-01-24 DIAGNOSIS — R0602 Shortness of breath: Secondary | ICD-10-CM | POA: Diagnosis not present

## 2017-01-24 DIAGNOSIS — R609 Edema, unspecified: Secondary | ICD-10-CM | POA: Diagnosis not present

## 2017-02-03 DIAGNOSIS — E7800 Pure hypercholesterolemia, unspecified: Secondary | ICD-10-CM | POA: Diagnosis not present

## 2017-02-03 DIAGNOSIS — E782 Mixed hyperlipidemia: Secondary | ICD-10-CM | POA: Diagnosis not present

## 2017-02-03 DIAGNOSIS — Z125 Encounter for screening for malignant neoplasm of prostate: Secondary | ICD-10-CM | POA: Diagnosis not present

## 2017-02-03 DIAGNOSIS — I10 Essential (primary) hypertension: Secondary | ICD-10-CM | POA: Diagnosis not present

## 2017-02-03 DIAGNOSIS — E118 Type 2 diabetes mellitus with unspecified complications: Secondary | ICD-10-CM | POA: Diagnosis not present

## 2017-02-07 DIAGNOSIS — M79605 Pain in left leg: Secondary | ICD-10-CM | POA: Diagnosis not present

## 2017-02-07 DIAGNOSIS — R0989 Other specified symptoms and signs involving the circulatory and respiratory systems: Secondary | ICD-10-CM | POA: Diagnosis not present

## 2017-02-07 DIAGNOSIS — R609 Edema, unspecified: Secondary | ICD-10-CM | POA: Diagnosis not present

## 2017-02-07 DIAGNOSIS — R0602 Shortness of breath: Secondary | ICD-10-CM | POA: Diagnosis not present

## 2017-02-07 DIAGNOSIS — M79604 Pain in right leg: Secondary | ICD-10-CM | POA: Diagnosis not present

## 2017-02-23 DIAGNOSIS — I10 Essential (primary) hypertension: Secondary | ICD-10-CM | POA: Diagnosis not present

## 2017-02-23 DIAGNOSIS — R609 Edema, unspecified: Secondary | ICD-10-CM | POA: Diagnosis not present

## 2017-02-23 DIAGNOSIS — I6523 Occlusion and stenosis of bilateral carotid arteries: Secondary | ICD-10-CM | POA: Diagnosis not present

## 2017-02-23 DIAGNOSIS — R0602 Shortness of breath: Secondary | ICD-10-CM | POA: Diagnosis not present

## 2017-03-03 DIAGNOSIS — R0602 Shortness of breath: Secondary | ICD-10-CM | POA: Diagnosis not present

## 2017-03-03 DIAGNOSIS — I10 Essential (primary) hypertension: Secondary | ICD-10-CM | POA: Diagnosis not present

## 2017-03-03 DIAGNOSIS — I6523 Occlusion and stenosis of bilateral carotid arteries: Secondary | ICD-10-CM | POA: Diagnosis not present

## 2017-03-03 DIAGNOSIS — I517 Cardiomegaly: Secondary | ICD-10-CM | POA: Diagnosis not present

## 2017-03-22 DIAGNOSIS — Z0000 Encounter for general adult medical examination without abnormal findings: Secondary | ICD-10-CM | POA: Diagnosis not present

## 2017-03-22 DIAGNOSIS — Z79899 Other long term (current) drug therapy: Secondary | ICD-10-CM | POA: Diagnosis not present

## 2017-03-22 DIAGNOSIS — Z113 Encounter for screening for infections with a predominantly sexual mode of transmission: Secondary | ICD-10-CM | POA: Diagnosis not present

## 2017-03-22 DIAGNOSIS — I119 Hypertensive heart disease without heart failure: Secondary | ICD-10-CM | POA: Diagnosis not present

## 2017-03-22 DIAGNOSIS — R600 Localized edema: Secondary | ICD-10-CM | POA: Diagnosis not present

## 2017-03-22 DIAGNOSIS — Z125 Encounter for screening for malignant neoplasm of prostate: Secondary | ICD-10-CM | POA: Diagnosis not present

## 2017-03-22 DIAGNOSIS — I10 Essential (primary) hypertension: Secondary | ICD-10-CM | POA: Diagnosis not present

## 2017-03-22 DIAGNOSIS — R739 Hyperglycemia, unspecified: Secondary | ICD-10-CM | POA: Diagnosis not present

## 2017-03-22 DIAGNOSIS — E7800 Pure hypercholesterolemia, unspecified: Secondary | ICD-10-CM | POA: Diagnosis not present

## 2017-04-18 DIAGNOSIS — R7303 Prediabetes: Secondary | ICD-10-CM | POA: Diagnosis not present

## 2017-04-18 DIAGNOSIS — I10 Essential (primary) hypertension: Secondary | ICD-10-CM | POA: Diagnosis not present

## 2017-04-18 DIAGNOSIS — E7800 Pure hypercholesterolemia, unspecified: Secondary | ICD-10-CM | POA: Diagnosis not present

## 2017-04-18 DIAGNOSIS — R609 Edema, unspecified: Secondary | ICD-10-CM | POA: Diagnosis not present

## 2017-06-20 DIAGNOSIS — E119 Type 2 diabetes mellitus without complications: Secondary | ICD-10-CM | POA: Diagnosis not present

## 2017-06-20 DIAGNOSIS — H401132 Primary open-angle glaucoma, bilateral, moderate stage: Secondary | ICD-10-CM | POA: Diagnosis not present

## 2017-06-20 DIAGNOSIS — H2513 Age-related nuclear cataract, bilateral: Secondary | ICD-10-CM | POA: Diagnosis not present

## 2017-07-19 DIAGNOSIS — R350 Frequency of micturition: Secondary | ICD-10-CM | POA: Diagnosis not present

## 2017-07-19 DIAGNOSIS — Z79899 Other long term (current) drug therapy: Secondary | ICD-10-CM | POA: Diagnosis not present

## 2017-07-19 DIAGNOSIS — E7800 Pure hypercholesterolemia, unspecified: Secondary | ICD-10-CM | POA: Diagnosis not present

## 2017-07-19 DIAGNOSIS — R7303 Prediabetes: Secondary | ICD-10-CM | POA: Diagnosis not present

## 2017-07-19 DIAGNOSIS — I10 Essential (primary) hypertension: Secondary | ICD-10-CM | POA: Diagnosis not present

## 2017-07-19 DIAGNOSIS — N401 Enlarged prostate with lower urinary tract symptoms: Secondary | ICD-10-CM | POA: Diagnosis not present

## 2017-08-21 DIAGNOSIS — G119 Hereditary ataxia, unspecified: Secondary | ICD-10-CM | POA: Diagnosis not present

## 2017-08-21 DIAGNOSIS — E7800 Pure hypercholesterolemia, unspecified: Secondary | ICD-10-CM | POA: Diagnosis not present

## 2017-08-21 DIAGNOSIS — I10 Essential (primary) hypertension: Secondary | ICD-10-CM | POA: Diagnosis not present

## 2017-08-21 DIAGNOSIS — R350 Frequency of micturition: Secondary | ICD-10-CM | POA: Diagnosis not present

## 2017-09-19 DIAGNOSIS — R32 Unspecified urinary incontinence: Secondary | ICD-10-CM | POA: Diagnosis not present

## 2017-09-19 DIAGNOSIS — R350 Frequency of micturition: Secondary | ICD-10-CM | POA: Diagnosis not present

## 2017-11-03 DIAGNOSIS — R0981 Nasal congestion: Secondary | ICD-10-CM | POA: Diagnosis not present

## 2017-11-03 DIAGNOSIS — E7800 Pure hypercholesterolemia, unspecified: Secondary | ICD-10-CM | POA: Diagnosis not present

## 2017-11-03 DIAGNOSIS — R351 Nocturia: Secondary | ICD-10-CM | POA: Diagnosis not present

## 2017-11-03 DIAGNOSIS — N401 Enlarged prostate with lower urinary tract symptoms: Secondary | ICD-10-CM | POA: Diagnosis not present

## 2017-11-03 DIAGNOSIS — I10 Essential (primary) hypertension: Secondary | ICD-10-CM | POA: Diagnosis not present

## 2017-11-03 DIAGNOSIS — R739 Hyperglycemia, unspecified: Secondary | ICD-10-CM | POA: Diagnosis not present

## 2017-11-15 DIAGNOSIS — R0602 Shortness of breath: Secondary | ICD-10-CM | POA: Diagnosis not present

## 2017-11-21 DIAGNOSIS — E7800 Pure hypercholesterolemia, unspecified: Secondary | ICD-10-CM | POA: Diagnosis not present

## 2017-11-21 DIAGNOSIS — R0902 Hypoxemia: Secondary | ICD-10-CM | POA: Diagnosis not present

## 2017-11-21 DIAGNOSIS — G119 Hereditary ataxia, unspecified: Secondary | ICD-10-CM | POA: Diagnosis not present

## 2017-11-21 DIAGNOSIS — I10 Essential (primary) hypertension: Secondary | ICD-10-CM | POA: Diagnosis not present

## 2017-12-07 DIAGNOSIS — G709 Myoneural disorder, unspecified: Secondary | ICD-10-CM | POA: Diagnosis not present

## 2017-12-07 DIAGNOSIS — I10 Essential (primary) hypertension: Secondary | ICD-10-CM | POA: Diagnosis not present

## 2017-12-07 DIAGNOSIS — R0902 Hypoxemia: Secondary | ICD-10-CM | POA: Diagnosis not present

## 2017-12-07 DIAGNOSIS — G4733 Obstructive sleep apnea (adult) (pediatric): Secondary | ICD-10-CM | POA: Diagnosis not present

## 2017-12-21 DIAGNOSIS — H547 Unspecified visual loss: Secondary | ICD-10-CM | POA: Diagnosis not present

## 2017-12-21 DIAGNOSIS — I10 Essential (primary) hypertension: Secondary | ICD-10-CM | POA: Diagnosis not present

## 2017-12-21 DIAGNOSIS — I517 Cardiomegaly: Secondary | ICD-10-CM | POA: Diagnosis not present

## 2017-12-21 DIAGNOSIS — R0902 Hypoxemia: Secondary | ICD-10-CM | POA: Diagnosis not present

## 2017-12-21 DIAGNOSIS — F329 Major depressive disorder, single episode, unspecified: Secondary | ICD-10-CM | POA: Diagnosis not present

## 2017-12-21 DIAGNOSIS — E785 Hyperlipidemia, unspecified: Secondary | ICD-10-CM | POA: Diagnosis not present

## 2017-12-21 DIAGNOSIS — E7800 Pure hypercholesterolemia, unspecified: Secondary | ICD-10-CM | POA: Diagnosis not present

## 2017-12-21 DIAGNOSIS — R351 Nocturia: Secondary | ICD-10-CM | POA: Diagnosis not present

## 2017-12-21 DIAGNOSIS — G119 Hereditary ataxia, unspecified: Secondary | ICD-10-CM | POA: Diagnosis not present

## 2017-12-25 DIAGNOSIS — G119 Hereditary ataxia, unspecified: Secondary | ICD-10-CM | POA: Diagnosis not present

## 2017-12-25 DIAGNOSIS — E785 Hyperlipidemia, unspecified: Secondary | ICD-10-CM | POA: Diagnosis not present

## 2017-12-25 DIAGNOSIS — R351 Nocturia: Secondary | ICD-10-CM | POA: Diagnosis not present

## 2017-12-25 DIAGNOSIS — E7800 Pure hypercholesterolemia, unspecified: Secondary | ICD-10-CM | POA: Diagnosis not present

## 2017-12-25 DIAGNOSIS — I517 Cardiomegaly: Secondary | ICD-10-CM | POA: Diagnosis not present

## 2017-12-25 DIAGNOSIS — F329 Major depressive disorder, single episode, unspecified: Secondary | ICD-10-CM | POA: Diagnosis not present

## 2017-12-25 DIAGNOSIS — H547 Unspecified visual loss: Secondary | ICD-10-CM | POA: Diagnosis not present

## 2017-12-25 DIAGNOSIS — R0902 Hypoxemia: Secondary | ICD-10-CM | POA: Diagnosis not present

## 2017-12-25 DIAGNOSIS — I10 Essential (primary) hypertension: Secondary | ICD-10-CM | POA: Diagnosis not present

## 2017-12-27 DIAGNOSIS — G3281 Cerebellar ataxia in diseases classified elsewhere: Secondary | ICD-10-CM | POA: Diagnosis not present

## 2017-12-27 DIAGNOSIS — E785 Hyperlipidemia, unspecified: Secondary | ICD-10-CM | POA: Diagnosis not present

## 2017-12-27 DIAGNOSIS — I517 Cardiomegaly: Secondary | ICD-10-CM | POA: Diagnosis not present

## 2017-12-27 DIAGNOSIS — H2513 Age-related nuclear cataract, bilateral: Secondary | ICD-10-CM | POA: Diagnosis not present

## 2017-12-27 DIAGNOSIS — E7800 Pure hypercholesterolemia, unspecified: Secondary | ICD-10-CM | POA: Diagnosis not present

## 2017-12-27 DIAGNOSIS — R0902 Hypoxemia: Secondary | ICD-10-CM | POA: Diagnosis not present

## 2017-12-27 DIAGNOSIS — E119 Type 2 diabetes mellitus without complications: Secondary | ICD-10-CM | POA: Diagnosis not present

## 2017-12-27 DIAGNOSIS — I10 Essential (primary) hypertension: Secondary | ICD-10-CM | POA: Diagnosis not present

## 2017-12-27 DIAGNOSIS — G119 Hereditary ataxia, unspecified: Secondary | ICD-10-CM | POA: Diagnosis not present

## 2017-12-27 DIAGNOSIS — H547 Unspecified visual loss: Secondary | ICD-10-CM | POA: Diagnosis not present

## 2017-12-27 DIAGNOSIS — F329 Major depressive disorder, single episode, unspecified: Secondary | ICD-10-CM | POA: Diagnosis not present

## 2017-12-27 DIAGNOSIS — R351 Nocturia: Secondary | ICD-10-CM | POA: Diagnosis not present

## 2017-12-27 DIAGNOSIS — H401132 Primary open-angle glaucoma, bilateral, moderate stage: Secondary | ICD-10-CM | POA: Diagnosis not present

## 2018-01-01 DIAGNOSIS — R0902 Hypoxemia: Secondary | ICD-10-CM | POA: Diagnosis not present

## 2018-01-01 DIAGNOSIS — G119 Hereditary ataxia, unspecified: Secondary | ICD-10-CM | POA: Diagnosis not present

## 2018-01-01 DIAGNOSIS — E785 Hyperlipidemia, unspecified: Secondary | ICD-10-CM | POA: Diagnosis not present

## 2018-01-01 DIAGNOSIS — I517 Cardiomegaly: Secondary | ICD-10-CM | POA: Diagnosis not present

## 2018-01-01 DIAGNOSIS — H547 Unspecified visual loss: Secondary | ICD-10-CM | POA: Diagnosis not present

## 2018-01-01 DIAGNOSIS — F329 Major depressive disorder, single episode, unspecified: Secondary | ICD-10-CM | POA: Diagnosis not present

## 2018-01-01 DIAGNOSIS — E7800 Pure hypercholesterolemia, unspecified: Secondary | ICD-10-CM | POA: Diagnosis not present

## 2018-01-01 DIAGNOSIS — I10 Essential (primary) hypertension: Secondary | ICD-10-CM | POA: Diagnosis not present

## 2018-01-01 DIAGNOSIS — R351 Nocturia: Secondary | ICD-10-CM | POA: Diagnosis not present

## 2018-01-03 DIAGNOSIS — R351 Nocturia: Secondary | ICD-10-CM | POA: Diagnosis not present

## 2018-01-03 DIAGNOSIS — E7800 Pure hypercholesterolemia, unspecified: Secondary | ICD-10-CM | POA: Diagnosis not present

## 2018-01-03 DIAGNOSIS — G119 Hereditary ataxia, unspecified: Secondary | ICD-10-CM | POA: Diagnosis not present

## 2018-01-03 DIAGNOSIS — I517 Cardiomegaly: Secondary | ICD-10-CM | POA: Diagnosis not present

## 2018-01-03 DIAGNOSIS — I10 Essential (primary) hypertension: Secondary | ICD-10-CM | POA: Diagnosis not present

## 2018-01-03 DIAGNOSIS — H547 Unspecified visual loss: Secondary | ICD-10-CM | POA: Diagnosis not present

## 2018-01-03 DIAGNOSIS — F329 Major depressive disorder, single episode, unspecified: Secondary | ICD-10-CM | POA: Diagnosis not present

## 2018-01-03 DIAGNOSIS — E785 Hyperlipidemia, unspecified: Secondary | ICD-10-CM | POA: Diagnosis not present

## 2018-01-03 DIAGNOSIS — R0902 Hypoxemia: Secondary | ICD-10-CM | POA: Diagnosis not present

## 2018-01-04 DIAGNOSIS — G119 Hereditary ataxia, unspecified: Secondary | ICD-10-CM | POA: Diagnosis not present

## 2018-01-04 DIAGNOSIS — R351 Nocturia: Secondary | ICD-10-CM | POA: Diagnosis not present

## 2018-01-04 DIAGNOSIS — I10 Essential (primary) hypertension: Secondary | ICD-10-CM | POA: Diagnosis not present

## 2018-01-04 DIAGNOSIS — I517 Cardiomegaly: Secondary | ICD-10-CM | POA: Diagnosis not present

## 2018-01-04 DIAGNOSIS — H547 Unspecified visual loss: Secondary | ICD-10-CM | POA: Diagnosis not present

## 2018-01-04 DIAGNOSIS — R0902 Hypoxemia: Secondary | ICD-10-CM | POA: Diagnosis not present

## 2018-01-04 DIAGNOSIS — E7800 Pure hypercholesterolemia, unspecified: Secondary | ICD-10-CM | POA: Diagnosis not present

## 2018-01-04 DIAGNOSIS — F329 Major depressive disorder, single episode, unspecified: Secondary | ICD-10-CM | POA: Diagnosis not present

## 2018-01-04 DIAGNOSIS — E785 Hyperlipidemia, unspecified: Secondary | ICD-10-CM | POA: Diagnosis not present

## 2018-01-08 DIAGNOSIS — H547 Unspecified visual loss: Secondary | ICD-10-CM | POA: Diagnosis not present

## 2018-01-08 DIAGNOSIS — G119 Hereditary ataxia, unspecified: Secondary | ICD-10-CM | POA: Diagnosis not present

## 2018-01-08 DIAGNOSIS — I517 Cardiomegaly: Secondary | ICD-10-CM | POA: Diagnosis not present

## 2018-01-08 DIAGNOSIS — E785 Hyperlipidemia, unspecified: Secondary | ICD-10-CM | POA: Diagnosis not present

## 2018-01-08 DIAGNOSIS — I10 Essential (primary) hypertension: Secondary | ICD-10-CM | POA: Diagnosis not present

## 2018-01-08 DIAGNOSIS — F329 Major depressive disorder, single episode, unspecified: Secondary | ICD-10-CM | POA: Diagnosis not present

## 2018-01-08 DIAGNOSIS — E7800 Pure hypercholesterolemia, unspecified: Secondary | ICD-10-CM | POA: Diagnosis not present

## 2018-01-08 DIAGNOSIS — R351 Nocturia: Secondary | ICD-10-CM | POA: Diagnosis not present

## 2018-01-08 DIAGNOSIS — R0902 Hypoxemia: Secondary | ICD-10-CM | POA: Diagnosis not present

## 2018-01-10 DIAGNOSIS — I10 Essential (primary) hypertension: Secondary | ICD-10-CM | POA: Diagnosis not present

## 2018-01-10 DIAGNOSIS — R351 Nocturia: Secondary | ICD-10-CM | POA: Diagnosis not present

## 2018-01-10 DIAGNOSIS — R0902 Hypoxemia: Secondary | ICD-10-CM | POA: Diagnosis not present

## 2018-01-10 DIAGNOSIS — I517 Cardiomegaly: Secondary | ICD-10-CM | POA: Diagnosis not present

## 2018-01-10 DIAGNOSIS — F329 Major depressive disorder, single episode, unspecified: Secondary | ICD-10-CM | POA: Diagnosis not present

## 2018-01-10 DIAGNOSIS — E785 Hyperlipidemia, unspecified: Secondary | ICD-10-CM | POA: Diagnosis not present

## 2018-01-10 DIAGNOSIS — G119 Hereditary ataxia, unspecified: Secondary | ICD-10-CM | POA: Diagnosis not present

## 2018-01-10 DIAGNOSIS — E7800 Pure hypercholesterolemia, unspecified: Secondary | ICD-10-CM | POA: Diagnosis not present

## 2018-01-10 DIAGNOSIS — H547 Unspecified visual loss: Secondary | ICD-10-CM | POA: Diagnosis not present

## 2018-01-15 DIAGNOSIS — E785 Hyperlipidemia, unspecified: Secondary | ICD-10-CM | POA: Diagnosis not present

## 2018-01-15 DIAGNOSIS — I517 Cardiomegaly: Secondary | ICD-10-CM | POA: Diagnosis not present

## 2018-01-15 DIAGNOSIS — H547 Unspecified visual loss: Secondary | ICD-10-CM | POA: Diagnosis not present

## 2018-01-15 DIAGNOSIS — E7800 Pure hypercholesterolemia, unspecified: Secondary | ICD-10-CM | POA: Diagnosis not present

## 2018-01-15 DIAGNOSIS — R351 Nocturia: Secondary | ICD-10-CM | POA: Diagnosis not present

## 2018-01-15 DIAGNOSIS — F329 Major depressive disorder, single episode, unspecified: Secondary | ICD-10-CM | POA: Diagnosis not present

## 2018-01-15 DIAGNOSIS — G119 Hereditary ataxia, unspecified: Secondary | ICD-10-CM | POA: Diagnosis not present

## 2018-01-15 DIAGNOSIS — R0902 Hypoxemia: Secondary | ICD-10-CM | POA: Diagnosis not present

## 2018-01-15 DIAGNOSIS — I10 Essential (primary) hypertension: Secondary | ICD-10-CM | POA: Diagnosis not present

## 2018-01-17 DIAGNOSIS — G4733 Obstructive sleep apnea (adult) (pediatric): Secondary | ICD-10-CM | POA: Diagnosis not present

## 2018-01-19 DIAGNOSIS — R351 Nocturia: Secondary | ICD-10-CM | POA: Diagnosis not present

## 2018-01-19 DIAGNOSIS — F329 Major depressive disorder, single episode, unspecified: Secondary | ICD-10-CM | POA: Diagnosis not present

## 2018-01-19 DIAGNOSIS — I10 Essential (primary) hypertension: Secondary | ICD-10-CM | POA: Diagnosis not present

## 2018-01-19 DIAGNOSIS — R0902 Hypoxemia: Secondary | ICD-10-CM | POA: Diagnosis not present

## 2018-01-19 DIAGNOSIS — E785 Hyperlipidemia, unspecified: Secondary | ICD-10-CM | POA: Diagnosis not present

## 2018-01-19 DIAGNOSIS — E7800 Pure hypercholesterolemia, unspecified: Secondary | ICD-10-CM | POA: Diagnosis not present

## 2018-01-19 DIAGNOSIS — H547 Unspecified visual loss: Secondary | ICD-10-CM | POA: Diagnosis not present

## 2018-01-19 DIAGNOSIS — I517 Cardiomegaly: Secondary | ICD-10-CM | POA: Diagnosis not present

## 2018-01-19 DIAGNOSIS — G119 Hereditary ataxia, unspecified: Secondary | ICD-10-CM | POA: Diagnosis not present

## 2018-01-22 DIAGNOSIS — I517 Cardiomegaly: Secondary | ICD-10-CM | POA: Diagnosis not present

## 2018-01-22 DIAGNOSIS — I10 Essential (primary) hypertension: Secondary | ICD-10-CM | POA: Diagnosis not present

## 2018-01-22 DIAGNOSIS — F329 Major depressive disorder, single episode, unspecified: Secondary | ICD-10-CM | POA: Diagnosis not present

## 2018-01-22 DIAGNOSIS — R0902 Hypoxemia: Secondary | ICD-10-CM | POA: Diagnosis not present

## 2018-01-22 DIAGNOSIS — R351 Nocturia: Secondary | ICD-10-CM | POA: Diagnosis not present

## 2018-01-22 DIAGNOSIS — G119 Hereditary ataxia, unspecified: Secondary | ICD-10-CM | POA: Diagnosis not present

## 2018-01-22 DIAGNOSIS — E7800 Pure hypercholesterolemia, unspecified: Secondary | ICD-10-CM | POA: Diagnosis not present

## 2018-01-22 DIAGNOSIS — H547 Unspecified visual loss: Secondary | ICD-10-CM | POA: Diagnosis not present

## 2018-01-22 DIAGNOSIS — E785 Hyperlipidemia, unspecified: Secondary | ICD-10-CM | POA: Diagnosis not present

## 2018-01-23 DIAGNOSIS — H547 Unspecified visual loss: Secondary | ICD-10-CM | POA: Diagnosis not present

## 2018-01-23 DIAGNOSIS — F329 Major depressive disorder, single episode, unspecified: Secondary | ICD-10-CM | POA: Diagnosis not present

## 2018-01-23 DIAGNOSIS — R0902 Hypoxemia: Secondary | ICD-10-CM | POA: Diagnosis not present

## 2018-01-23 DIAGNOSIS — E7800 Pure hypercholesterolemia, unspecified: Secondary | ICD-10-CM | POA: Diagnosis not present

## 2018-01-23 DIAGNOSIS — E785 Hyperlipidemia, unspecified: Secondary | ICD-10-CM | POA: Diagnosis not present

## 2018-01-23 DIAGNOSIS — R351 Nocturia: Secondary | ICD-10-CM | POA: Diagnosis not present

## 2018-01-23 DIAGNOSIS — I10 Essential (primary) hypertension: Secondary | ICD-10-CM | POA: Diagnosis not present

## 2018-01-23 DIAGNOSIS — I517 Cardiomegaly: Secondary | ICD-10-CM | POA: Diagnosis not present

## 2018-01-23 DIAGNOSIS — G119 Hereditary ataxia, unspecified: Secondary | ICD-10-CM | POA: Diagnosis not present

## 2018-01-24 DIAGNOSIS — I10 Essential (primary) hypertension: Secondary | ICD-10-CM | POA: Diagnosis not present

## 2018-01-24 DIAGNOSIS — E785 Hyperlipidemia, unspecified: Secondary | ICD-10-CM | POA: Diagnosis not present

## 2018-01-24 DIAGNOSIS — R351 Nocturia: Secondary | ICD-10-CM | POA: Diagnosis not present

## 2018-01-24 DIAGNOSIS — R0902 Hypoxemia: Secondary | ICD-10-CM | POA: Diagnosis not present

## 2018-01-24 DIAGNOSIS — F329 Major depressive disorder, single episode, unspecified: Secondary | ICD-10-CM | POA: Diagnosis not present

## 2018-01-24 DIAGNOSIS — H547 Unspecified visual loss: Secondary | ICD-10-CM | POA: Diagnosis not present

## 2018-01-24 DIAGNOSIS — G119 Hereditary ataxia, unspecified: Secondary | ICD-10-CM | POA: Diagnosis not present

## 2018-01-24 DIAGNOSIS — I517 Cardiomegaly: Secondary | ICD-10-CM | POA: Diagnosis not present

## 2018-01-24 DIAGNOSIS — E7800 Pure hypercholesterolemia, unspecified: Secondary | ICD-10-CM | POA: Diagnosis not present

## 2018-01-26 DIAGNOSIS — R05 Cough: Secondary | ICD-10-CM | POA: Diagnosis not present

## 2018-01-26 DIAGNOSIS — R0602 Shortness of breath: Secondary | ICD-10-CM | POA: Diagnosis not present

## 2018-01-26 DIAGNOSIS — H6122 Impacted cerumen, left ear: Secondary | ICD-10-CM | POA: Diagnosis not present

## 2018-01-26 DIAGNOSIS — Z23 Encounter for immunization: Secondary | ICD-10-CM | POA: Diagnosis not present

## 2018-01-27 ENCOUNTER — Emergency Department (HOSPITAL_COMMUNITY)
Admission: EM | Admit: 2018-01-27 | Discharge: 2018-01-28 | Disposition: A | Discharge status: Home / Self Care | Payer: Medicare HMO | Attending: Emergency Medicine | Admitting: Emergency Medicine

## 2018-01-27 ENCOUNTER — Other Ambulatory Visit: Payer: Self-pay

## 2018-01-27 ENCOUNTER — Emergency Department (HOSPITAL_COMMUNITY): Payer: Medicare HMO

## 2018-01-27 ENCOUNTER — Encounter (HOSPITAL_COMMUNITY): Payer: Self-pay

## 2018-01-27 DIAGNOSIS — J988 Other specified respiratory disorders: Secondary | ICD-10-CM | POA: Insufficient documentation

## 2018-01-27 DIAGNOSIS — M7989 Other specified soft tissue disorders: Secondary | ICD-10-CM | POA: Diagnosis not present

## 2018-01-27 DIAGNOSIS — E785 Hyperlipidemia, unspecified: Secondary | ICD-10-CM | POA: Diagnosis not present

## 2018-01-27 DIAGNOSIS — R0602 Shortness of breath: Secondary | ICD-10-CM | POA: Diagnosis not present

## 2018-01-27 DIAGNOSIS — Z79899 Other long term (current) drug therapy: Secondary | ICD-10-CM | POA: Insufficient documentation

## 2018-01-27 DIAGNOSIS — R29810 Facial weakness: Secondary | ICD-10-CM | POA: Insufficient documentation

## 2018-01-27 DIAGNOSIS — R079 Chest pain, unspecified: Secondary | ICD-10-CM | POA: Diagnosis not present

## 2018-01-27 DIAGNOSIS — R29818 Other symptoms and signs involving the nervous system: Secondary | ICD-10-CM | POA: Diagnosis not present

## 2018-01-27 DIAGNOSIS — J9811 Atelectasis: Secondary | ICD-10-CM | POA: Diagnosis not present

## 2018-01-27 LAB — POCT I-STAT TROPONIN I: TROPONIN I, POC: 0 ng/mL (ref 0.00–0.08)

## 2018-01-27 LAB — POCT I-STAT, CHEM 8
BUN: 9 mg/dL (ref 8–23)
CALCIUM ION: 1.12 mmol/L — AB (ref 1.15–1.40)
CREATININE: 0.7 mg/dL (ref 0.61–1.24)
Chloride: 103 mmol/L (ref 98–111)
GLUCOSE: 100 mg/dL — AB (ref 70–99)
HCT: 50 % (ref 39.0–52.0)
HEMOGLOBIN: 17 g/dL (ref 13.0–17.0)
POTASSIUM: 3.5 mmol/L (ref 3.5–5.1)
Sodium: 140 mmol/L (ref 135–145)
TCO2: 29 mmol/L (ref 22–32)

## 2018-01-27 LAB — URINALYSIS, ROUTINE W REFLEX MICROSCOPIC
BILIRUBIN URINE: NEGATIVE
Bacteria, UA: NONE SEEN
GLUCOSE, UA: NEGATIVE mg/dL
Hgb urine dipstick: NEGATIVE
KETONES UR: NEGATIVE mg/dL
Leukocytes, UA: NEGATIVE
NITRITE: NEGATIVE
PH: 7 (ref 5.0–8.0)
Protein, ur: NEGATIVE mg/dL
SPECIFIC GRAVITY, URINE: 1.011 (ref 1.005–1.030)

## 2018-01-27 LAB — BASIC METABOLIC PANEL
Anion gap: 7 (ref 5–15)
BUN: 11 mg/dL (ref 8–23)
CALCIUM: 9.1 mg/dL (ref 8.9–10.3)
CO2: 29 mmol/L (ref 22–32)
CREATININE: 0.72 mg/dL (ref 0.61–1.24)
Chloride: 104 mmol/L (ref 98–111)
GFR calc non Af Amer: >60 mL/min (ref >60)
GLUCOSE: 103 mg/dL — AB (ref 70–99)
Potassium: 3.6 mmol/L (ref 3.5–5.1)
Sodium: 140 mmol/L (ref 135–145)

## 2018-01-27 LAB — CBC
HCT: 49.1 % (ref 39.0–52.0)
Hemoglobin: 15.3 g/dL (ref 13.0–17.0)
MCH: 28.4 pg (ref 26.0–34.0)
MCHC: 31.2 g/dL (ref 30.0–36.0)
MCV: 91.3 fL (ref 80.0–100.0)
PLATELETS: 255 10*3/uL (ref 150–400)
RBC: 5.38 MIL/uL (ref 4.22–5.81)
RDW: 14.7 % (ref 11.5–15.5)
WBC: 9.4 10*3/uL (ref 4.0–10.5)
nRBC: 0 % (ref 0.0–0.2)

## 2018-01-27 LAB — BRAIN NATRIURETIC PEPTIDE: B Natriuretic Peptide: 104.2 pg/mL — ABNORMAL HIGH (ref 0.0–100.0)

## 2018-01-27 LAB — BLOOD GAS, VENOUS
ACID-BASE EXCESS: 4 mmol/L — AB (ref 0.0–2.0)
BICARBONATE: 31.5 mmol/L — AB (ref 20.0–28.0)
O2 Saturation: 28.2 %
PATIENT TEMPERATURE: 98.6
PH VEN: 7.33 (ref 7.250–7.430)
pCO2, Ven: 61.4 mmHg — ABNORMAL HIGH (ref 44.0–60.0)

## 2018-01-27 MED ORDER — PREDNISONE 20 MG PO TABS
60.0000 mg | ORAL_TABLET | Freq: Once | ORAL | Status: AC
  Administered 2018-01-27: 60 mg via ORAL
  Filled 2018-01-27: qty 3

## 2018-01-27 MED ORDER — PREDNISONE 20 MG PO TABS: ORAL_TABLET | ORAL | 0 refills | Status: DC

## 2018-01-27 MED ORDER — SODIUM CHLORIDE (PF) 0.9 % IJ SOLN
INTRAMUSCULAR | Status: AC
  Filled 2018-01-27: qty 50

## 2018-01-27 MED ORDER — ALBUTEROL SULFATE HFA 108 (90 BASE) MCG/ACT IN AERS
4.0000 | INHALATION_SPRAY | Freq: Once | RESPIRATORY_TRACT | Status: AC
  Administered 2018-01-28: 4 via RESPIRATORY_TRACT
  Filled 2018-01-27: qty 6.7

## 2018-01-27 MED ORDER — IPRATROPIUM-ALBUTEROL 0.5-2.5 (3) MG/3ML IN SOLN
3.0000 mL | RESPIRATORY_TRACT | Status: AC
  Administered 2018-01-27 (×3): 3 mL via RESPIRATORY_TRACT
  Filled 2018-01-27: qty 9

## 2018-01-27 MED ORDER — HYDROCOD POLST-CPM POLST ER 10-8 MG/5ML PO SUER: 5.0000 mL | Freq: Two times a day (BID) | ORAL | 0 refills | Status: DC | PRN

## 2018-01-27 MED ORDER — IOPAMIDOL (ISOVUE-370) INJECTION 76%
100.0000 mL | Freq: Once | INTRAVENOUS | Status: AC | PRN
  Administered 2018-01-27: 100 mL via INTRAVENOUS

## 2018-01-27 MED ORDER — AEROCHAMBER PLUS FLO-VU MEDIUM MISC
1.0000 | Freq: Once | Status: AC
  Administered 2018-01-28: 1
  Filled 2018-01-27: qty 1

## 2018-01-27 MED ORDER — IOPAMIDOL (ISOVUE-370) INJECTION 76%
INTRAVENOUS | Status: AC
  Filled 2018-01-27: qty 100

## 2018-01-27 NOTE — ED Provider Notes (Signed)
[facility]-EMERGENCY DEPT Provider Note   CSN: [id number] Arrival date & time: 01/27/18  1727     History   Chief Complaint Chief Complaint  Patient presents with  . Shortness of Breath    HPI Donald Stokes is a 72 y.o. male.  72 yo M with a cc of sob.  Going on for past two months.  Patient seen multiple times by family doc without improvement.  Family is concerned something is going on.  Worse usually at night, no longer able to sleep flat, has been sleeping up in a recliner.  Patient did a trial of lasix with improvement of lower extremity edema but no improvement of his orthopnea.  Per the family he has been pacing at night saying that he is having trouble breathing.  He was seen at urgent care last night and was thought to have bronchitis was given albuterol inhaler and Mucinex and discharge.  Patient's records were reviewed and he had seen the neurologist about 2 months ago and in their note they commented that he had seen the pulmonologist with a benign work-ups previously.  As I was  leaving the room the family stated that the patient has been drooling out the right side of his mouth.  Stated that this been going on for the past 3 weeks.  He said he has been coughing quite a bit when he tries to swallow.  The history is provided by the patient.  Shortness of Breath  This is a new problem. The average episode lasts 2 months. The problem occurs continuously.The current episode started more than 1 week ago. The problem has been gradually worsening. Associated symptoms include cough and leg swelling. Pertinent negatives include no fever, no headaches, no chest pain, no vomiting, no abdominal pain and no rash. He has tried nothing for the symptoms. The treatment provided no relief. He has had no prior hospitalizations. He has had no prior ED visits. Associated medical issues do not include pneumonia, chronic lung disease or heart failure.    Past Medical  History:  Diagnosis Date  . Abnormality of gait    uses cane at home/  walker and or scooter for longer distance  . Allergic rhinitis, cause unspecified   . BPH (benign prostatic hypertrophy)   . Claustrophobia   . Depression   . ED (erectile dysfunction)   . Glaucoma of both eyes   . Lower urinary tract symptoms (LUTS)   . Memory loss    mild  . Microscopic hematuria   . Mixed hyperlipidemia   . 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 Sebaceous cyst    left inguinal  . Severe dysarthria    speech --  w/ mild hypophonic  . Unspecified essential hypertension   . Wears glasses     Patient Active Problem List   Diagnosis Date Noted  . 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 CYSTOSCOPY N/A 12/26/2014   Procedure: CYSTOSCOPY;  Surgeon: Malen Gauze, MD;  Location: [facility];  Service: Urology;  Laterality: N/A;  . NEGATIVE SLEEP STUDY  April 2016--  per pt  . NO PAST SURGERIES    . SCROTAL EXPLORATION Left 12/26/2014   Procedure: LEFT INGUINAL SEBACEOUS CYST EXCISION;  Surgeon: Malen Gauze, MD;  Location: [facility];  Service: Urology;  Laterality: Left;        Home Medications    Prior to Admission medications   Medication Sig Start Date End Date Taking? Authorizing Provider  amLODipine (NORVASC) 10 MG tablet Take 10 mg by mouth daily. 11/04/16   [provider]  aspirin 81 MG tablet Take 81 mg by mouth daily.    [provider]  Chlorphen-Phenyleph-APAP (CORICIDIN D COLD/FLU/SINUS) 2-5-325 MG TABS Take 1 tablet every 8 (eight) hours as needed by mouth (sinus congestion).    [provider]  chlorpheniramine-HYDROcodone (TUSSIONEX PENNKINETIC ER) 10-8 MG/5ML SUER Take 5 mLs by mouth every 12 (twelve) hours as needed for cough.  01/27/18   Melene Plan, DO  ezetimibe (ZETIA) 10 MG tablet Take 10 mg by mouth daily.    [provider]  fenofibrate (TRICOR) 145 MG tablet Take 145 mg by mouth daily.    [provider]  furosemide (LASIX) 20 MG tablet Take 1 tablet (20 mg total) 2 (two) times daily for 5 days by mouth. 01/12/17 01/17/17  Shaune Pollack, MD  irbesartan (AVAPRO) 300 MG tablet Take 300 mg daily by mouth. 01/03/17   [provider]  predniSONE (DELTASONE) 20 MG tablet 2 tabs po daily x 4 days 01/27/18   Melene Plan, DO  Travoprost, BAK Free, (TRAVATAN) 0.004 % SOLN ophthalmic solution Place 1 drop into both eyes at bedtime.    [provider]    Family History Family History  Problem Relation Age of Onset  . Cancer Mother   . Heart failure Father   . Multiple sclerosis Son   . Stroke Maternal Grandmother   . Heart failure Maternal Uncle     Social History Social History   Tobacco Use  . Smoking status: Never Smoker  . Smokeless tobacco: Never Used  Substance Use Topics  . Alcohol use: No    Alcohol/week: 0.0 standard drinks  . Drug use: No     Allergies   Patient has no known allergies.   Review of Systems Review of Systems  Constitutional: Negative for chills and fever.  HENT: Positive for congestion. Negative for facial swelling.   Eyes: Negative for discharge and visual disturbance.  Respiratory: Positive for cough and shortness of breath.   Cardiovascular: Positive for leg swelling. Negative for chest pain and palpitations.  Gastrointestinal: Negative for abdominal pain, diarrhea and vomiting.  Musculoskeletal: Negative for arthralgias and myalgias.  Skin: Negative for color change and rash.  Neurological: Negative for tremors, syncope and headaches.  Psychiatric/Behavioral: Negative for confusion and dysphoric mood.     Physical Exam Updated Vital Signs BP (!) 178/109 (BP Location: Left Arm)   Pulse 72   Temp 99 F (37.2 C) (Oral)   Resp  (!) 22   SpO2 95%   Physical Exam  Constitutional: He is oriented to person, place, and time. He appears well-developed and well-nourished.  HENT:  Head: Normocephalic and atraumatic.  Swollen turbinates, posterior nasal drip, no noted sinus ttp, tm normal bilaterally.    Eyes: Pupils are equal, round, and reactive to light. EOM are normal.  Neck: Normal range of motion. Neck supple. No JVD present.  Cardiovascular: Normal rate and regular rhythm. Exam reveals no gallop and no friction rub.  No murmur heard. Pulmonary/Chest: No respiratory distress. He has no wheezes.  Abdominal: He exhibits no distension. There is no rebound and no guarding.  Musculoskeletal: Normal range of motion.  Neurological: He is alert and oriented to person, place, and time.  asymmetric  palate elevation, Right sided facial droop.  Ataxia worst with LUE.   Skin: No rash noted. No pallor.  Psychiatric: He has a normal mood and affect. His behavior is normal.  Nursing note and vitals reviewed.    ED Treatments / Results  Labs (all labs ordered are listed, but only abnormal results are displayed) Labs Reviewed  BASIC METABOLIC PANEL - Abnormal; Notable for the following components:      Result Value   Glucose, Bld 103 (*)    All other components within normal limits  BRAIN NATRIURETIC PEPTIDE - Abnormal; Notable for the following components:   B Natriuretic Peptide 104.2 (*)    All other components within normal limits  BLOOD GAS, VENOUS - Abnormal; Notable for the following components:   pCO2, Ven 61.4 (*)    Bicarbonate 31.5 (*)    Acid-Base Excess 4.0 (*)    All other components within normal limits  URINALYSIS, ROUTINE W REFLEX MICROSCOPIC - Abnormal; Notable for the following components:   Color, Urine STRAW (*)    All other components within normal limits  POCT I-STAT, CHEM 8 - Abnormal; Notable for the following components:   Glucose, Bld 100 (*)    Calcium, Ion 1.12 (*)    All other  components within normal limits  CBC  I-STAT TROPONIN, ED  I-STAT CHEM 8, ED  POCT I-STAT TROPONIN I    EKG EKG Interpretation  Date/Time:  Saturday January 27 2018 19:27:41 EST Ventricular Rate:  70 PR Interval:    QRS Duration: 92 QT Interval:  423 QTC Calculation: 457 R Axis:   -2 Text Interpretation:  Sinus rhythm No significant change since last tracing Confirmed by Melene PlanFloyd, Leondro Coryell [phone]) on 01/27/2018 7:40:01 PM   Radiology Dg Chest 2 View  Result Date: 01/27/2018 CLINICAL DATA:  Unable to breathe for 2 days, history hypertension EXAM: CHEST - 2 VIEW COMPARISON:  01/12/2017 FINDINGS: Normal heart size, mediastinal contours, and pulmonary vascularity. Mild bibasilar atelectasis. Remaining lungs clear. No infiltrate, pleural effusion or pneumothorax. Osseous structures unremarkable. IMPRESSION: Bibasilar atelectasis. Electronically Signed   By: Ulyses SouthwardMark  Boles M.D.   On: 01/27/2018 18:29   Ct Head Wo Contrast  Result Date: 01/27/2018 CLINICAL DATA:  Focal neural deficit greater than 6 hours suspicious for stroke. EXAM: CT HEAD WITHOUT CONTRAST TECHNIQUE: Contiguous axial images were obtained from the base of the skull through the vertex without intravenous contrast. COMPARISON:  MRI brain 07/30/2013 FINDINGS: Brain: Diffuse cerebral atrophy. Mild ventricular dilatation consistent with central atrophy. Patchy low-attenuation changes in the deep white matter consistent small vessel ischemia. No mass-effect or midline shift. No abnormal extra-axial fluid collections. Gray-white matter junctions are distinct. Basal cisterns are not effaced. No acute intracranial hemorrhage. Vascular: Moderate intracranial arterial vascular calcifications. Skull: Calvarium appears intact. No acute depressed skull fractures. Sinuses/Orbits: Mucosal thickening in the paranasal sinuses with opacification of ethmoid air cells, likely inflammatory. No acute air-fluid levels. Diffuse opacification of right mastoid air  cells is similar to prior study. Left mastoid air cells are clear. Other: None. IMPRESSION: No acute intracranial abnormalities. Chronic atrophy and small vessel ischemic changes. Chronic right mastoid effusions. Electronically Signed   By: Burman NievesWilliam  Stevens M.D.   On: 01/27/2018 21:10   Ct Angio Chest Pe W And/or Wo Contrast  Result Date: 01/27/2018 CLINICAL DATA:  Shortness of breath, weakness, and chest pain. EXAM: CT ANGIOGRAPHY CHEST WITH CONTRAST TECHNIQUE: Multidetector CT imaging of the chest was performed using the standard protocol during bolus administration of intravenous  contrast. Multiplanar CT image reconstructions and MIPs were obtained to evaluate the vascular anatomy. CONTRAST:  ISOVUE-370 IOPAMIDOL (ISOVUE-370) INJECTION 76% COMPARISON:  None. FINDINGS: Cardiovascular: Motion artifact limits examination. There is good opacification of the central and segmental pulmonary arteries. No focal filling defects demonstrated. No evidence of significant pulmonary embolus. Cardiac enlargement. No pericardial effusions. Normal caliber thoracic aorta with few scattered calcifications. Mediastinum/Nodes: Esophagus is decompressed. No significant lymphadenopathy in the chest. Lungs/Pleura: Motion artifact limits examination. Areas of atelectasis in the lung bases. No airspace disease. Airways are patent. No pleural effusions. No pneumothorax. Upper Abdomen: No acute abnormalities identified. Musculoskeletal: No chest wall abnormality. No acute or significant osseous findings. Review of the MIP images confirms the above findings. IMPRESSION: No evidence of significant pulmonary embolus. No evidence of active pulmonary disease. Atelectasis in the lung bases. Electronically Signed   By: Burman Nieves M.D.   On: 01/27/2018 21:58    Procedures Procedures (including critical care time)  Medications Ordered in ED Medications  sodium chloride (PF) 0.9 % injection (has no administration in time  range)  iopamidol (ISOVUE-370) 76 % injection (has no administration in time range)  albuterol (PROVENTIL HFA;VENTOLIN HFA) 108 (90 Base) MCG/ACT inhaler 4 puff (has no administration in time range)  aerochamber plus with mask device 1 each (has no administration in time range)  ipratropium-albuterol (DUONEB) 0.5-2.5 (3) MG/3ML nebulizer solution 3 mL (3 mLs Nebulization Given 01/27/18 1944)  predniSONE (DELTASONE) tablet 60 mg (60 mg Oral Given 01/27/18 2155)  iopamidol (ISOVUE-370) 76 % injection 100 mL (100 mLs Intravenous Contrast Given 01/27/18 2119)     Initial Impression / Assessment and Plan / ED Course  I have reviewed the triage vital signs and the nursing notes.  Pertinent labs & imaging results that were available during my care of the patient were reviewed by me and considered in my medical decision making (see chart for details).  Clinical Course as of Jan 28 2320  Sat Jan 27, 2018  2059 CT head without acute finding as viewed by me, will discuss with neurology.  Initial istat with renal function at baseline, will CT chest.    [DF]    Clinical Course User Index [DF] Melene Plan, DO    72 yo M with a chief complaints of shortness of breath.  Going on for the past 2 months.  My exam with diffuse wheezes, sinus congestion.  Clinically now he has a upper respiratory illness though does not explain his 2 months of symptoms and orthopnea.  Chest x-ray reviewed by me without focal infiltrate.  Patient and review of systems have been drooling, has new right-sided facial droop, will obtain a CT of the head.  Will discuss with neurology post CT.  I discussed the case with Dr. Jerrell Belfast, neurology he felt that the patient's symptoms may be due to his underlying neurologic disorder.  Upon record review he pointed out that the patient had trouble breathing as far back as last year.  CT scan of the chest was negative for PE as well for lung pathology.  He has a venous blood gas that has very  mild CO2 retention.  Patient's breathing is significantly better after DuoNeb's and steroids.  I think the patient has a viral illness that is causing an acute worsening of his chronic breathing status.  We will do burst dose of steroids and home with an inhaler have him follow-up with the pulmonologist as well as his neurologist.  11:21 PM:  I have  discussed the diagnosis/risks/treatment options with the patient and family and believe the pt to be eligible for discharge home to follow-up with PCP, neuro, pulm. We also discussed returning to the ED immediately if new or worsening sx occur. We discussed the sx which are most concerning (e.g., sudden worsening pain, fever, inability to tolerate by mouth) that necessitate immediate return. Medications administered to the patient during their visit and any new prescriptions provided to the patient are listed below.  Medications given during this visit Medications  sodium chloride (PF) 0.9 % injection (has no administration in time range)  iopamidol (ISOVUE-370) 76 % injection (has no administration in time range)  albuterol (PROVENTIL HFA;VENTOLIN HFA) 108 (90 Base) MCG/ACT inhaler 4 puff (has no administration in time range)  aerochamber plus with mask device 1 each (has no administration in time range)  ipratropium-albuterol (DUONEB) 0.5-2.5 (3) MG/3ML nebulizer solution 3 mL (3 mLs Nebulization Given 01/27/18 1944)  predniSONE (DELTASONE) tablet 60 mg (60 mg Oral Given 01/27/18 2155)  iopamidol (ISOVUE-370) 76 % injection 100 mL (100 mLs Intravenous Contrast Given 01/27/18 2119)      The patient appears reasonably screen and/or stabilized for discharge and I doubt any other medical condition or other [facility] requiring further screening, evaluation, or treatment in the ED at this time prior to discharge.   Final Clinical Impressions(s) / ED Diagnoses   Final diagnoses:  Wheezing-associated respiratory infection (WARI)    ED Discharge Orders          Ordered    predniSONE (DELTASONE) 20 MG tablet     01/27/18 2311    chlorpheniramine-HYDROcodone (TUSSIONEX PENNKINETIC ER) 10-8 MG/5ML SUER  Every 12 hours PRN     01/27/18 2319           Melene Plan, DO 01/27/18 2321

## 2018-01-27 NOTE — ED Triage Notes (Signed)
Per Pt's Family: Pt's family reports pt has been unable to breathe properly for the past 2 days. Pt reports he has not slept well because of this. Pt reports he was given mucinex to take. Pt's family reports a hx of cerebral atrophy

## 2018-01-27 NOTE — Discharge Instructions (Signed)
Use your inhaler every 4 hours(6 puffs) while awake, return for sudden worsening shortness of breath, or if you need to use your inhaler more often.  ° °

## 2018-01-27 NOTE — Progress Notes (Addendum)
Attempted NIF per md rx.  This RT made multiple attempts at two different times to perform test.  Pt unable to coordinate breath with testing and unable to get lips closed around NIF device properly to get an adequate seal/reading.  RN aware.

## 2018-01-29 DIAGNOSIS — R0902 Hypoxemia: Secondary | ICD-10-CM | POA: Diagnosis not present

## 2018-01-29 DIAGNOSIS — H547 Unspecified visual loss: Secondary | ICD-10-CM | POA: Diagnosis not present

## 2018-01-29 DIAGNOSIS — I10 Essential (primary) hypertension: Secondary | ICD-10-CM | POA: Diagnosis not present

## 2018-01-29 DIAGNOSIS — G119 Hereditary ataxia, unspecified: Secondary | ICD-10-CM | POA: Diagnosis not present

## 2018-01-29 DIAGNOSIS — F329 Major depressive disorder, single episode, unspecified: Secondary | ICD-10-CM | POA: Diagnosis not present

## 2018-01-29 DIAGNOSIS — E7800 Pure hypercholesterolemia, unspecified: Secondary | ICD-10-CM | POA: Diagnosis not present

## 2018-01-29 DIAGNOSIS — R351 Nocturia: Secondary | ICD-10-CM | POA: Diagnosis not present

## 2018-01-29 DIAGNOSIS — E785 Hyperlipidemia, unspecified: Secondary | ICD-10-CM | POA: Diagnosis not present

## 2018-01-29 DIAGNOSIS — I517 Cardiomegaly: Secondary | ICD-10-CM | POA: Diagnosis not present

## 2018-01-31 DIAGNOSIS — F329 Major depressive disorder, single episode, unspecified: Secondary | ICD-10-CM | POA: Diagnosis not present

## 2018-01-31 DIAGNOSIS — R0902 Hypoxemia: Secondary | ICD-10-CM | POA: Diagnosis not present

## 2018-01-31 DIAGNOSIS — G7089 Other specified myoneural disorders: Secondary | ICD-10-CM | POA: Diagnosis not present

## 2018-01-31 DIAGNOSIS — H547 Unspecified visual loss: Secondary | ICD-10-CM | POA: Diagnosis not present

## 2018-01-31 DIAGNOSIS — E785 Hyperlipidemia, unspecified: Secondary | ICD-10-CM | POA: Diagnosis not present

## 2018-01-31 DIAGNOSIS — G119 Hereditary ataxia, unspecified: Secondary | ICD-10-CM | POA: Diagnosis not present

## 2018-01-31 DIAGNOSIS — G4733 Obstructive sleep apnea (adult) (pediatric): Secondary | ICD-10-CM | POA: Diagnosis not present

## 2018-01-31 DIAGNOSIS — G4731 Primary central sleep apnea: Secondary | ICD-10-CM | POA: Diagnosis not present

## 2018-01-31 DIAGNOSIS — E7800 Pure hypercholesterolemia, unspecified: Secondary | ICD-10-CM | POA: Diagnosis not present

## 2018-01-31 DIAGNOSIS — I10 Essential (primary) hypertension: Secondary | ICD-10-CM | POA: Diagnosis not present

## 2018-01-31 DIAGNOSIS — I517 Cardiomegaly: Secondary | ICD-10-CM | POA: Diagnosis not present

## 2018-01-31 DIAGNOSIS — R351 Nocturia: Secondary | ICD-10-CM | POA: Diagnosis not present

## 2018-02-02 DIAGNOSIS — F329 Major depressive disorder, single episode, unspecified: Secondary | ICD-10-CM | POA: Diagnosis not present

## 2018-02-02 DIAGNOSIS — I10 Essential (primary) hypertension: Secondary | ICD-10-CM | POA: Diagnosis not present

## 2018-02-02 DIAGNOSIS — H547 Unspecified visual loss: Secondary | ICD-10-CM | POA: Diagnosis not present

## 2018-02-02 DIAGNOSIS — R351 Nocturia: Secondary | ICD-10-CM | POA: Diagnosis not present

## 2018-02-02 DIAGNOSIS — E785 Hyperlipidemia, unspecified: Secondary | ICD-10-CM | POA: Diagnosis not present

## 2018-02-02 DIAGNOSIS — R0902 Hypoxemia: Secondary | ICD-10-CM | POA: Diagnosis not present

## 2018-02-02 DIAGNOSIS — E7800 Pure hypercholesterolemia, unspecified: Secondary | ICD-10-CM | POA: Diagnosis not present

## 2018-02-02 DIAGNOSIS — G119 Hereditary ataxia, unspecified: Secondary | ICD-10-CM | POA: Diagnosis not present

## 2018-02-02 DIAGNOSIS — I517 Cardiomegaly: Secondary | ICD-10-CM | POA: Diagnosis not present

## 2018-02-05 DIAGNOSIS — R351 Nocturia: Secondary | ICD-10-CM | POA: Diagnosis not present

## 2018-02-05 DIAGNOSIS — R490 Dysphonia: Secondary | ICD-10-CM | POA: Diagnosis not present

## 2018-02-05 DIAGNOSIS — F329 Major depressive disorder, single episode, unspecified: Secondary | ICD-10-CM | POA: Diagnosis not present

## 2018-02-05 DIAGNOSIS — G119 Hereditary ataxia, unspecified: Secondary | ICD-10-CM | POA: Diagnosis not present

## 2018-02-05 DIAGNOSIS — I10 Essential (primary) hypertension: Secondary | ICD-10-CM | POA: Diagnosis not present

## 2018-02-05 DIAGNOSIS — R0902 Hypoxemia: Secondary | ICD-10-CM | POA: Diagnosis not present

## 2018-02-05 DIAGNOSIS — H547 Unspecified visual loss: Secondary | ICD-10-CM | POA: Diagnosis not present

## 2018-02-05 DIAGNOSIS — R1312 Dysphagia, oropharyngeal phase: Secondary | ICD-10-CM | POA: Diagnosis not present

## 2018-02-05 DIAGNOSIS — I517 Cardiomegaly: Secondary | ICD-10-CM | POA: Diagnosis not present

## 2018-02-06 DIAGNOSIS — H547 Unspecified visual loss: Secondary | ICD-10-CM | POA: Diagnosis not present

## 2018-02-06 DIAGNOSIS — R0902 Hypoxemia: Secondary | ICD-10-CM | POA: Diagnosis not present

## 2018-02-06 DIAGNOSIS — I10 Essential (primary) hypertension: Secondary | ICD-10-CM | POA: Diagnosis not present

## 2018-02-06 DIAGNOSIS — F329 Major depressive disorder, single episode, unspecified: Secondary | ICD-10-CM | POA: Diagnosis not present

## 2018-02-06 DIAGNOSIS — G119 Hereditary ataxia, unspecified: Secondary | ICD-10-CM | POA: Diagnosis not present

## 2018-02-06 DIAGNOSIS — R351 Nocturia: Secondary | ICD-10-CM | POA: Diagnosis not present

## 2018-02-06 DIAGNOSIS — I517 Cardiomegaly: Secondary | ICD-10-CM | POA: Diagnosis not present

## 2018-02-06 DIAGNOSIS — R490 Dysphonia: Secondary | ICD-10-CM | POA: Diagnosis not present

## 2018-02-06 DIAGNOSIS — R1312 Dysphagia, oropharyngeal phase: Secondary | ICD-10-CM | POA: Diagnosis not present

## 2018-02-07 DIAGNOSIS — R0602 Shortness of breath: Secondary | ICD-10-CM | POA: Diagnosis not present

## 2018-02-07 DIAGNOSIS — R0902 Hypoxemia: Secondary | ICD-10-CM | POA: Diagnosis not present

## 2018-02-07 DIAGNOSIS — R1312 Dysphagia, oropharyngeal phase: Secondary | ICD-10-CM | POA: Diagnosis not present

## 2018-02-07 DIAGNOSIS — R351 Nocturia: Secondary | ICD-10-CM | POA: Diagnosis not present

## 2018-02-07 DIAGNOSIS — F329 Major depressive disorder, single episode, unspecified: Secondary | ICD-10-CM | POA: Diagnosis not present

## 2018-02-07 DIAGNOSIS — H547 Unspecified visual loss: Secondary | ICD-10-CM | POA: Diagnosis not present

## 2018-02-07 DIAGNOSIS — G4733 Obstructive sleep apnea (adult) (pediatric): Secondary | ICD-10-CM | POA: Diagnosis not present

## 2018-02-07 DIAGNOSIS — R490 Dysphonia: Secondary | ICD-10-CM | POA: Diagnosis not present

## 2018-02-07 DIAGNOSIS — I517 Cardiomegaly: Secondary | ICD-10-CM | POA: Diagnosis not present

## 2018-02-07 DIAGNOSIS — G119 Hereditary ataxia, unspecified: Secondary | ICD-10-CM | POA: Diagnosis not present

## 2018-02-07 DIAGNOSIS — I10 Essential (primary) hypertension: Secondary | ICD-10-CM | POA: Diagnosis not present

## 2018-02-07 DIAGNOSIS — Z09 Encounter for follow-up examination after completed treatment for conditions other than malignant neoplasm: Secondary | ICD-10-CM | POA: Diagnosis not present

## 2018-02-14 DIAGNOSIS — G119 Hereditary ataxia, unspecified: Secondary | ICD-10-CM | POA: Diagnosis not present

## 2018-02-14 DIAGNOSIS — R0601 Orthopnea: Secondary | ICD-10-CM | POA: Diagnosis not present

## 2018-02-14 DIAGNOSIS — R942 Abnormal results of pulmonary function studies: Secondary | ICD-10-CM | POA: Diagnosis not present

## 2018-02-14 DIAGNOSIS — R0902 Hypoxemia: Secondary | ICD-10-CM | POA: Diagnosis not present

## 2018-02-15 DIAGNOSIS — R1312 Dysphagia, oropharyngeal phase: Secondary | ICD-10-CM | POA: Diagnosis not present

## 2018-02-15 DIAGNOSIS — G119 Hereditary ataxia, unspecified: Secondary | ICD-10-CM | POA: Diagnosis not present

## 2018-02-15 DIAGNOSIS — I517 Cardiomegaly: Secondary | ICD-10-CM | POA: Diagnosis not present

## 2018-02-15 DIAGNOSIS — F329 Major depressive disorder, single episode, unspecified: Secondary | ICD-10-CM | POA: Diagnosis not present

## 2018-02-15 DIAGNOSIS — H547 Unspecified visual loss: Secondary | ICD-10-CM | POA: Diagnosis not present

## 2018-02-15 DIAGNOSIS — R351 Nocturia: Secondary | ICD-10-CM | POA: Diagnosis not present

## 2018-02-15 DIAGNOSIS — I10 Essential (primary) hypertension: Secondary | ICD-10-CM | POA: Diagnosis not present

## 2018-02-15 DIAGNOSIS — R490 Dysphonia: Secondary | ICD-10-CM | POA: Diagnosis not present

## 2018-02-15 DIAGNOSIS — R0902 Hypoxemia: Secondary | ICD-10-CM | POA: Diagnosis not present

## 2018-02-21 DIAGNOSIS — I10 Essential (primary) hypertension: Secondary | ICD-10-CM | POA: Diagnosis not present

## 2018-02-21 DIAGNOSIS — I517 Cardiomegaly: Secondary | ICD-10-CM | POA: Diagnosis not present

## 2018-02-21 DIAGNOSIS — R1312 Dysphagia, oropharyngeal phase: Secondary | ICD-10-CM | POA: Diagnosis not present

## 2018-02-21 DIAGNOSIS — G119 Hereditary ataxia, unspecified: Secondary | ICD-10-CM | POA: Diagnosis not present

## 2018-02-21 DIAGNOSIS — R0902 Hypoxemia: Secondary | ICD-10-CM | POA: Diagnosis not present

## 2018-02-21 DIAGNOSIS — F329 Major depressive disorder, single episode, unspecified: Secondary | ICD-10-CM | POA: Diagnosis not present

## 2018-02-21 DIAGNOSIS — H547 Unspecified visual loss: Secondary | ICD-10-CM | POA: Diagnosis not present

## 2018-02-21 DIAGNOSIS — R490 Dysphonia: Secondary | ICD-10-CM | POA: Diagnosis not present

## 2018-02-21 DIAGNOSIS — R351 Nocturia: Secondary | ICD-10-CM | POA: Diagnosis not present

## 2018-02-23 DIAGNOSIS — I517 Cardiomegaly: Secondary | ICD-10-CM | POA: Diagnosis not present

## 2018-02-23 DIAGNOSIS — R490 Dysphonia: Secondary | ICD-10-CM | POA: Diagnosis not present

## 2018-02-23 DIAGNOSIS — R1312 Dysphagia, oropharyngeal phase: Secondary | ICD-10-CM | POA: Diagnosis not present

## 2018-02-23 DIAGNOSIS — I10 Essential (primary) hypertension: Secondary | ICD-10-CM | POA: Diagnosis not present

## 2018-02-23 DIAGNOSIS — F329 Major depressive disorder, single episode, unspecified: Secondary | ICD-10-CM | POA: Diagnosis not present

## 2018-02-23 DIAGNOSIS — G119 Hereditary ataxia, unspecified: Secondary | ICD-10-CM | POA: Diagnosis not present

## 2018-02-23 DIAGNOSIS — R351 Nocturia: Secondary | ICD-10-CM | POA: Diagnosis not present

## 2018-02-23 DIAGNOSIS — H547 Unspecified visual loss: Secondary | ICD-10-CM | POA: Diagnosis not present

## 2018-02-23 DIAGNOSIS — R0902 Hypoxemia: Secondary | ICD-10-CM | POA: Diagnosis not present

## 2018-03-09 DIAGNOSIS — F329 Major depressive disorder, single episode, unspecified: Secondary | ICD-10-CM | POA: Diagnosis not present

## 2018-03-09 DIAGNOSIS — G119 Hereditary ataxia, unspecified: Secondary | ICD-10-CM | POA: Diagnosis not present

## 2018-03-09 DIAGNOSIS — R351 Nocturia: Secondary | ICD-10-CM | POA: Diagnosis not present

## 2018-03-09 DIAGNOSIS — R0902 Hypoxemia: Secondary | ICD-10-CM | POA: Diagnosis not present

## 2018-03-09 DIAGNOSIS — I10 Essential (primary) hypertension: Secondary | ICD-10-CM | POA: Diagnosis not present

## 2018-03-09 DIAGNOSIS — I517 Cardiomegaly: Secondary | ICD-10-CM | POA: Diagnosis not present

## 2018-03-09 DIAGNOSIS — R490 Dysphonia: Secondary | ICD-10-CM | POA: Diagnosis not present

## 2018-03-09 DIAGNOSIS — H547 Unspecified visual loss: Secondary | ICD-10-CM | POA: Diagnosis not present

## 2018-03-09 DIAGNOSIS — R1312 Dysphagia, oropharyngeal phase: Secondary | ICD-10-CM | POA: Diagnosis not present

## 2018-03-13 DIAGNOSIS — R0902 Hypoxemia: Secondary | ICD-10-CM | POA: Diagnosis not present

## 2018-03-13 DIAGNOSIS — G119 Hereditary ataxia, unspecified: Secondary | ICD-10-CM | POA: Diagnosis not present

## 2018-03-13 DIAGNOSIS — I10 Essential (primary) hypertension: Secondary | ICD-10-CM | POA: Diagnosis not present

## 2018-03-13 DIAGNOSIS — Z6829 Body mass index (BMI) 29.0-29.9, adult: Secondary | ICD-10-CM | POA: Diagnosis not present

## 2018-03-13 DIAGNOSIS — R0602 Shortness of breath: Secondary | ICD-10-CM | POA: Diagnosis not present

## 2018-03-20 DIAGNOSIS — G4733 Obstructive sleep apnea (adult) (pediatric): Secondary | ICD-10-CM | POA: Diagnosis not present

## 2018-03-30 DIAGNOSIS — I6523 Occlusion and stenosis of bilateral carotid arteries: Secondary | ICD-10-CM | POA: Diagnosis not present

## 2018-03-30 DIAGNOSIS — I517 Cardiomegaly: Secondary | ICD-10-CM | POA: Diagnosis not present

## 2018-03-30 DIAGNOSIS — R0602 Shortness of breath: Secondary | ICD-10-CM | POA: Diagnosis not present

## 2018-03-30 DIAGNOSIS — I7389 Other specified peripheral vascular diseases: Secondary | ICD-10-CM | POA: Diagnosis not present

## 2018-03-30 DIAGNOSIS — I739 Peripheral vascular disease, unspecified: Secondary | ICD-10-CM | POA: Diagnosis not present

## 2018-03-30 DIAGNOSIS — I10 Essential (primary) hypertension: Secondary | ICD-10-CM | POA: Diagnosis not present

## 2018-03-30 DIAGNOSIS — I119 Hypertensive heart disease without heart failure: Secondary | ICD-10-CM | POA: Diagnosis not present

## 2018-03-30 DIAGNOSIS — R061 Stridor: Secondary | ICD-10-CM | POA: Diagnosis not present

## 2018-04-04 ENCOUNTER — Ambulatory Visit: Payer: Medicare HMO | Admitting: Neurology

## 2018-04-04 ENCOUNTER — Encounter: Payer: Self-pay | Admitting: Neurology

## 2018-04-04 VITALS — BP 138/90 | HR 71 | Ht 71.0 in | Wt 219.0 lb

## 2018-04-04 DIAGNOSIS — G238 Other specified degenerative diseases of basal ganglia: Secondary | ICD-10-CM | POA: Diagnosis not present

## 2018-04-04 NOTE — Progress Notes (Signed)
Subjective:    Patient ID: Donald Stokes is a 73 y.o. male.  HPI     Interim history:   Mr. 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 He is accompanied by his wife again today. I last saw him on 11/10/2016, at which time he reported having shortness of breath when lying down. He had seen a pulmonologist. He had a chest x-ray and was told that his lung function was okay. He was started on Lasix by his PCP. He had difficulty sleeping and was given Remeron but it made him feel groggy. I suggested he try melatonin. He was advised to discuss with his PCP the possibility of seeing a cardiologist. We talked about aspiration risk.  Today, 04/04/2018 (all dictated new, as well as above notes, some dictation done in note pad or Word, outside of chart, may appear as copied): He reports having SOB when lying down. He has been told that his lungs are fine, he went to the ER for this and UC at [facility]. He had a recent sleep study and was told that he has obstructive sleep apnea but he could not complete the sleep study and he could not tolerate CPAP. He has had more anxiety at night. He had treatment in the ER on 01/27/18 with neb and few days of oral prednisone. He felt that the nebulizer had been helpful. He also feels that Symbicort has been somewhat helpful, this is through his pulmonologist. 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 His speech has become worse. He uses a 4 wheeled rolling walker. They are requesting a new walker. He had a head CT without contrast on 01/27/2018 and I reviewed the results: IMPRESSION: No acute intracranial abnormalities. Chronic atrophy and small vessel ischemic changes. Chronic right mastoid effusions.  He had a CT angio chest on 01/27/2018 and I reviewed the results:  IMPRESSION: No evidence of significant pulmonary embolus. No evidence of active pulmonary disease. Atelectasis in the lung bases.  The patient's allergies, current medications, family history, past medical history, past social history, past surgical history and problem list were reviewed and updated as appropriate.    Previously (copied from previous notes for reference):   I saw him on 01/12/2015, at which time he reported having more high blood pressure values. He had seen a urologist recently and was tried on [place], but had side effects. He then tried Myrbetriq, but because of blood pressure elevation he was told to stop the medication. He had some medication changes for his blood pressure control. His wife was worried about his depressed mood. His wife was looking into getting some help at the house as he needed help with his ADLs including bathing, but dressing, hygiene. She was still working full-time and needed help at the house. He was using a rolling walker but needed a new walker. His MMSE was 25/30, CDT: 2/4, AFT: 14/min, GDS: 10/15 at the time. He wanted to avoid any new medications at the time because he recently had blood pressure medication changes and wanted to wait things out more. I made a referral to home health and also put in a prescription for a new walker.    I saw him on 07/09/2014 at which time he reported feeling fairly stable. He had nocturia about 3 times on an average night. He had a recent change in  his blood pressure medication and no longer on amlodipine and he was on Hyzaar twice daily. His memory scores were stable. We agreed to monitor his symptoms.    I saw him on 01/08/2014, at which time he reported having had a sleep study in May 2015 and [place]. He reported that they were told he had no sleep apnea. He saw ENT and was treated with antibiotics for an ear infection and an ear tube was placed. He reported worsening balance. He reported more nocturia. His  wife felt that he was depressed. I made a referral to urology. I also encouraged him to discuss depression treatment with his primary care physician. His memory scores were stable. His MMSE was 26 out of 30 at the time.   I first met him on 07/08/2013 at the request of his primary care physician, at which time the patient reported forgetfulness and short-term memory issues. He also reported some swallowing issues. He was using a rolling walker and also had a scooter for longer distances and inside the house he was using a cane or was holding onto things. I suggested referral to home health occupational therapy. He was advised to no longer drive. I requested a brain MRI without contrast. He had this on 07/30/2013: Abnormal MRI brain (without) demonstrating: 1. Severe pontine and cerebellar atrophy. 2. Few punctate foci of periventricular and subcortical foci of non-specific gliosis. 3. Severe fluid/inflammation in the right mastoid air cells. Non-specific mild fluid and mucosal thickening in the sphenoid, ethmoid and maxillary sinuses. I also ordered a swallow study. He had this on 07/26/2013: This did not show any evidence of aspiration and he did not have any coughing or choking during the procedure. He was not advised to change his food or liquid consistency. General recommendations were given regarding aspiration precautions.   He was seen by Dr. 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 He had a brain MRI in 03/1999, which apparently showed prominent cerebellar and brainstem atrophy. He has not had a swallow study. He does not have a FHx of cerebellar ataxia. His son has MS.   He has fallen, but not in the last 1 1/2 months and thankfully has not hurt himself. He tends to trip and catches himself. He has sleep issues, including insomnia at night and EDS. He wakes with up with SOB, but does not have apneic pauses. In fact, he had a sleep study in  [place] some 2 weeks ago, which per wife did not show any significant sleep disorder. He has control of his B/B function. He reports some depression and anxiety, but denies SI/HI, and has no hallucinations, no delusions, and no evidence of RBD. He sleeps poorly and is restless. He sleeps best between 5 AM and 8 AM. He denies RLS Sx and there is no report of PLMs. He still drives. His Past Medical History Is Significant For: Past Medical History:  Diagnosis Date  . Abnormality of gait    uses cane at home/  walker and or scooter for longer distance  . Allergic rhinitis, cause unspecified   . BPH (benign prostatic hypertrophy)   . Claustrophobia   . Depression   . ED (erectile dysfunction)   . Glaucoma of both eyes   . Lower urinary tract symptoms (LUTS)   . Memory loss    mild  . Microscopic hematuria   . Mixed hyperlipidemia   . 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 Sebaceous cyst    left inguinal  . Severe dysarthria    speech --  w/ mild hypophonic  . Unspecified essential hypertension   . Wears glasses     His Past Surgical History Is Significant For: Past Surgical History:  Procedure Laterality Date  . CYSTOSCOPY N/A 12/26/2014   Procedure: CYSTOSCOPY;  Surgeon: Cleon Gustin, MD;  Location: [facility];  Service: Urology;  Laterality: N/A;  . NEGATIVE SLEEP STUDY  April 2016--  per pt  . NO PAST SURGERIES    . SCROTAL EXPLORATION Left 12/26/2014   Procedure: LEFT INGUINAL SEBACEOUS CYST EXCISION;  Surgeon: Cleon Gustin, MD;  Location: [facility];  Service: Urology;  Laterality: Left;    His Family History Is Significant For: Family History  Problem Relation Age of Onset  . Cancer Mother   . Heart failure Father   . Multiple sclerosis Son   . Stroke Maternal Grandmother   . Heart failure Maternal Uncle      His Social History Is Significant For: Social History   Socioeconomic History  . Marital status: Married    Spouse name: Hassan Rowan  . Number of children: 4  . Years of education: 76  . Highest education level: Not on file  Occupational History    Comment: retired  Scientific laboratory technician  . Financial resource strain: Not on file  . Food insecurity:    Worry: Not on file    Inability: Not on file  . Transportation needs:    Medical: Not on file    Non-medical: Not on file  Tobacco Use  . Smoking status: Never Smoker  . Smokeless tobacco: Never Used  Substance and Sexual Activity  . Alcohol use: No    Alcohol/week: 0.0 standard drinks  . Drug use: No  . Sexual activity: Not on file  Lifestyle  . Physical activity:    Days per week: Not on file    Minutes per session: Not on file  . Stress: Not on file  Relationships  . Social connections:    Talks on phone: Not on file    Gets together: Not on file    Attends religious service: Not on file    Active member of club or organization: Not on file    Attends meetings of clubs or organizations: Not on file    Relationship status: Not on file  Other Topics Concern  . Not on file  Social History Narrative   Patient is right handed and consumes no caffeine    His Allergies Are:  No Known Allergies:   His Current Medications Are:  Outpatient Encounter Medications as of 04/04/2018  Medication Sig  . aspirin 81 MG tablet Take 81 mg by mouth daily.  Marland Kitchen ezetimibe (ZETIA) 10 MG tablet Take 10 mg by mouth daily.  . fenofibrate (TRICOR) 145 MG tablet Take 145 mg by mouth daily.  . irbesartan (AVAPRO) 300 MG tablet Take 300 mg daily by mouth.  . Travoprost, BAK Free, (TRAVATAN) 0.004 % SOLN ophthalmic solution Place 1 drop into both eyes at bedtime.  . [EXPIRED] furosemide (LASIX) 20 MG tablet Take 1 tablet (20 mg total) 2 (two) times daily for 5 days by mouth.  . [DISCONTINUED] amLODipine (NORVASC) 10 MG tablet Take 10 mg by mouth daily.   . [DISCONTINUED] Chlorphen-Phenyleph-APAP (CORICIDIN D COLD/FLU/SINUS) 2-5-325 MG TABS Take 1 tablet  every 8 (eight) hours as needed by mouth (sinus congestion).  . [DISCONTINUED] chlorpheniramine-HYDROcodone (TUSSIONEX PENNKINETIC ER) 10-8 MG/5ML SUER Take 5 mLs by mouth every 12 (twelve) hours as needed for cough.  . [DISCONTINUED] predniSONE (DELTASONE) 20 MG tablet 2 tabs po daily x 4 days   No facility-administered encounter medications on file as of 04/04/2018.   :  Review of Systems:  Out of a complete 14 point review of systems, all are reviewed and negative with the exception of these symptoms as listed below:  Review of Systems  Neurological:       Pt presents today to discuss his breathing and ataxia. Pt reports that his pulmonologist has ruled out pulmonary causes for his trouble breathing and told pt to return to neurology. Pt can't tolerate a sleep study nor a cpap. Pt feels like he is suffocating when he lies flat and has slept in a recliner for the past year.    Objective:  Neurological Exam  Physical Exam Physical Examination:   Vitals:   04/04/18 1025  BP: (!) 178/103  Pulse: 71   General Examination: The patient is a very pleasant 73 y.o. male in no acute distress. He appears chronically ill, has gained a little weight. Well groomed.   HEENT:Normocephalic, atraumatic, Has significant difficulty tracking. He has mild facial masking. Speech is significantly dysarthric.  Chest:is clear to auscultation without wheezing, rhonchi or crackles noted.  Heart:sounds are regular and normal without murmurs, rubs or gallops noted.   Abdomen:is soft, non-tender and non-distended.  Extremities:There is no obvious edema.   Skin: is warm and dry with no trophic changes noted.   Musculoskeletal: exam reveals no obvious joint deformities, tenderness, joint swelling or erythema.  Neurologically:  Mental status: The patient is awake and alert, paying good  attention. He is able to partially provide the history. His wife provides most of his history. He is oriented to: person, place, time/date, situation, day of week, month of year and year. His memory, attention, language and knowledge are mildly impaired, but appear stable. There is no aphasia, agnosia, apraxia or anomia. There is a mild degree of bradyphrenia. Speech is dysarthric with scanning noted. Mood is normal and affect is normal.   (On 07/08/13: MMSE score is 26/30. CDT is 3/4. AFT (Animal Fluency Test) score is 9.  On 01/08/2014: MMSE 26/30, CDT is 3/4, AFT 18/min. Geriatric depression score is 12 out of 15. On 07/09/2014: MMSE: 25/30, CDT: 3/4, AFT: 11/min. GDS: 12/15. On 01/12/2015: MMSE: 25/30, CDT: 2/4, AFT: 14/min, GDS: 10/15.  On 11/10/2016: MMSE: 25/30, CDT: 2/4, AFT: 12/min.)   Cranial nerves are as described above under HEENT exam.  Motor exam: Normal bulk, and strength for age is noted. There are no dyskinesias noted.  Tone is not rigid with absence of cogwheeling in the extremities. There is overall moderate bradykinesia.  There is no resting tremor.  Romberg is not testable, as he cannot stand unsupported.  Reflexes are 1+ in the upper extremities and trace in the lower extremities. Fine motor skills exam:  fine motor skills are moderately impaired, has dysmetria.   Sensory exam is intact to light touch in the upper and lower extremities.   Gait, station and balance: He stands up from the seated position with moderate difficulty and needs to push himself up. He stands wide-based. He relies on his rolling walker. Balance is impaired. His walk is slow and unsteady, wide-based.   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 He has had motor decline, some associated mild memory loss. He has had more difficulty with his breathing including shortness of breath,  particularly at night. He has seen a pulmonologist for this. He had an attempt sleep study as I understand but was not able to completed. He was noted to have obstructive sleep apnea and was tried on CPAP per wife's report could not tolerate it. Unfortunately, I am not sure where to go from here as far as further investigating his shortness of breath. I don't have much in the way of further testing or treatment for him. He did feel some improvement with the nebulizer treatment in the emergency room on 01/27/2018 and has Symbicort inhaler which sometimes helps. He has been sleeping in a recliner and uses his inhaler before bedtime. I offered him a referral to an academic neurology movement disorders center and they are agreeable to go to [facility]. Maybe the movement disorder specialist in the academic realm can offer some additional advice, treatment or testing. Recent head CT without contrast did not show any acute findings. He was ruled out for PE through a CT angiogram of the chest in the emergency room setting as well. We again talked about aspiration risk. We talked about his gait disorder gait safety. I will prescribe a new walker for him. I have placed a referral to to neurology, movement d/o department. From my end of things I suggested as needed follow-up. I answered all the questions today and the patient and his wife were in agreement.  I spent 25 minutes in total face-to-face time with the patient, more than 50% of which was spent in counseling and coordination of care, reviewing test results, reviewing medication and discussing or reviewing the diagnosis of OSA, its prognosis and treatment options. Pertinent laboratory and imaging test results that were available during this visit with the patient were reviewed by me and considered in my medical decision making (see chart for details).

## 2018-04-04 NOTE — Patient Instructions (Addendum)
As discussed, we will refer you to [facility], movement disorders department and neurology for further evaluation and advise regarding your symptoms, particularly your shortness of breath.

## 2018-04-17 ENCOUNTER — Telehealth: Payer: Self-pay | Admitting: Neurology

## 2018-04-17 DIAGNOSIS — R269 Unspecified abnormalities of gait and mobility: Secondary | ICD-10-CM | POA: Diagnosis not present

## 2018-04-17 DIAGNOSIS — G119 Hereditary ataxia, unspecified: Secondary | ICD-10-CM | POA: Diagnosis not present

## 2018-04-17 DIAGNOSIS — G238 Other specified degenerative diseases of basal ganglia: Secondary | ICD-10-CM | POA: Diagnosis not present

## 2018-04-17 NOTE — Telephone Encounter (Signed)
Received this notice from [facility]: "I've looked in this pt's account in our system and I'm not showing that we ever received an order for him to get a rollator or that his wife spoke to anyone. My guess is they talked to one of our call center associates who didn't see the order in our fax system so told them we needed information and that she mentioned incontinence supplies.   I'll pull the rollator order and get this part taken care of. Insurance may not cover since he got one in 2016 but it was with a different insurance so he could be good. I'm really not sure what's going on but I'll get the rollator done."

## 2018-04-17 NOTE — Telephone Encounter (Signed)
I have asked [facility] to help with this. They have access to Epic and can pull what they need. I am not sure why we need to order incontinence supplies. [facility] will get back to me.

## 2018-04-17 NOTE — Telephone Encounter (Signed)
Pt's wife Steward Drone (on [place]) called stating that she called [facility] and they informed her that to get the Rollator for the pt they will be needing the Face to Face, the order that includes the Diagnosis code and it also needs to include the pts Incontinence supplies. This needs to be faxed over to [phone] attention Felicia. Please advise.

## 2018-09-19 DIAGNOSIS — Z125 Encounter for screening for malignant neoplasm of prostate: Secondary | ICD-10-CM | POA: Diagnosis not present

## 2018-09-19 DIAGNOSIS — E7800 Pure hypercholesterolemia, unspecified: Secondary | ICD-10-CM | POA: Diagnosis not present

## 2018-09-19 DIAGNOSIS — I10 Essential (primary) hypertension: Secondary | ICD-10-CM | POA: Diagnosis not present

## 2018-09-19 DIAGNOSIS — G119 Hereditary ataxia, unspecified: Secondary | ICD-10-CM | POA: Diagnosis not present

## 2018-09-19 DIAGNOSIS — Z79899 Other long term (current) drug therapy: Secondary | ICD-10-CM | POA: Diagnosis not present

## 2018-09-19 DIAGNOSIS — Z0000 Encounter for general adult medical examination without abnormal findings: Secondary | ICD-10-CM | POA: Diagnosis not present

## 2018-09-19 DIAGNOSIS — Z1159 Encounter for screening for other viral diseases: Secondary | ICD-10-CM | POA: Diagnosis not present

## 2018-10-31 DIAGNOSIS — R1310 Dysphagia, unspecified: Secondary | ICD-10-CM | POA: Diagnosis not present

## 2018-10-31 DIAGNOSIS — R471 Dysarthria and anarthria: Secondary | ICD-10-CM | POA: Diagnosis not present

## 2018-10-31 DIAGNOSIS — Z79899 Other long term (current) drug therapy: Secondary | ICD-10-CM | POA: Diagnosis not present

## 2018-10-31 DIAGNOSIS — G119 Hereditary ataxia, unspecified: Secondary | ICD-10-CM | POA: Diagnosis not present

## 2018-10-31 DIAGNOSIS — R270 Ataxia, unspecified: Secondary | ICD-10-CM | POA: Diagnosis not present

## 2018-10-31 DIAGNOSIS — R278 Other lack of coordination: Secondary | ICD-10-CM | POA: Diagnosis not present

## 2018-11-04 DIAGNOSIS — I10 Essential (primary) hypertension: Secondary | ICD-10-CM | POA: Diagnosis not present

## 2018-11-04 DIAGNOSIS — E785 Hyperlipidemia, unspecified: Secondary | ICD-10-CM | POA: Diagnosis not present

## 2018-11-04 DIAGNOSIS — R270 Ataxia, unspecified: Secondary | ICD-10-CM | POA: Diagnosis not present

## 2018-11-04 DIAGNOSIS — Z7982 Long term (current) use of aspirin: Secondary | ICD-10-CM | POA: Diagnosis not present

## 2018-11-04 DIAGNOSIS — R32 Unspecified urinary incontinence: Secondary | ICD-10-CM | POA: Diagnosis not present

## 2018-11-04 DIAGNOSIS — G3281 Cerebellar ataxia in diseases classified elsewhere: Secondary | ICD-10-CM | POA: Diagnosis not present

## 2018-11-04 DIAGNOSIS — G319 Degenerative disease of nervous system, unspecified: Secondary | ICD-10-CM | POA: Diagnosis not present

## 2018-11-04 DIAGNOSIS — Z9181 History of falling: Secondary | ICD-10-CM | POA: Diagnosis not present

## 2018-11-06 ENCOUNTER — Telehealth: Payer: Self-pay | Admitting: Neurology

## 2018-11-06 NOTE — Telephone Encounter (Signed)
We haven't ordered PT for this pt. Can you call [facility] and let them know this?

## 2018-11-06 NOTE — Telephone Encounter (Signed)
PT Kyle has called to report the frequency for PT 2 week 1, 1 week 1 and 2 week 2

## 2018-11-13 NOTE — Telephone Encounter (Signed)
Called message below she is aware we didn't order.

## 2019-09-23 ENCOUNTER — Telehealth: Payer: Self-pay | Admitting: Internal Medicine

## 2019-09-23 NOTE — Telephone Encounter (Signed)
Called patient to schedule Palliative Consult, no answer - left message with reason for call along with my name and contact number. 

## 2019-09-25 ENCOUNTER — Telehealth: Payer: Self-pay | Admitting: Internal Medicine

## 2019-09-25 NOTE — Telephone Encounter (Signed)
Called wife back to let her know that NP could see patient on 10/01/19 @ 3 PM and she was in agreement with this.

## 2019-09-25 NOTE — Telephone Encounter (Signed)
Spoke with wife, Steward Drone, regarding Palliative services and all questions were answered and she was in agreement with this.  Explained to wife that I needed to reach out to other NP's to see when they could see patient (due to NP assigned to this patient is booked and will be out of the office for a couple of weeks) and told her that I would call her back to schedule visit and she was in agreement with this.

## 2019-10-01 ENCOUNTER — Other Ambulatory Visit: Payer: Medicare HMO | Admitting: Internal Medicine

## 2019-10-01 ENCOUNTER — Encounter: Payer: Self-pay | Admitting: Internal Medicine

## 2019-10-01 ENCOUNTER — Other Ambulatory Visit: Payer: Self-pay

## 2019-10-01 DIAGNOSIS — Z7189 Other specified counseling: Secondary | ICD-10-CM

## 2019-10-01 DIAGNOSIS — Z515 Encounter for palliative care: Secondary | ICD-10-CM

## 2019-10-01 NOTE — Progress Notes (Signed)
July 27th, 2021 [facility] Palliative Care Consult Note Telephone: [phone]  Fax: [phone]  PATIENT NAME: Akim Watkinson DOB: 11-May-1945 MRN: [id number]  [address] [place],  [address] (entrance on the R side of the building)  First Floor. [place] [address]  PRIMARY CARE PROVIDER: Ronnald Collum [facility]  [address] [place],  [place] [address]  REFERRING PROVIDER: Joesph July, NP  RESPONSIBLE PARTY: Bralon, Antkowiak (Spouse) [phone] [facility] Phone)  ASSESSMENT / RECOMMENDATIONS:  1. Advance Care Planning: A. Directives: Spouse has paperwork regarding Living Will and HCPOA, but they haven't really addressed. Spouse stated he would wish withdrawal of life support setting of persistent vegetative state. We talked of the importance of naming a first and a second HCPOA. Spouse aware these forms would need to be notarized. We reviewed components of the MOST form and completed as follows: Full resuscitative efforts in the event of a cardiopulmonary arrest. Full scope of Medical Intervention but discontinue mechanical ventilation if poor chance of extubation. Yes to IVFs and Antibiotics. No to tube feedings.  B. Goals of Care: To gain in strength and endurance with Physical Therapy Services. Hoping to learn techniques to improve mobility and ease of ADLs from Occupational Therapy. Steward Drone has no desire to place patient in facility care.   2. Cognitive / Functional status, Symptom Management: Patient is alert and pleasantly conversant. He speaks on topic in short sentences. He states he has episodic visual and audible hallucinations seem worse at night, and these keep him awake. These hallucinations include hearing strange persons talking to him, exiting out of doors that disappear. Can be frightening. Spouse notes patient tends to sleep during the day and stay up at night. However, since starting low dose Seroquel at hs  have decreased hallucinations and improved sleep. However, spouse is very concerned about possible side effects. We discussed possible addition of low dose Aricept to potentially help with the hallucinations. Steward Drone is going to run this by patient's neurologist. Patient feels sleeping better now with less nasal congestion which was helped by Flonase spray via nare, as well as prn Allegra.   Continues with moderate HTN. BP's in the 130's/80's range on multi drug regimen.  Ambulates with rolling walker which he uses consistently d/t past falls. Last fall d/t backing up. Physical Therapist has been working with him on improved techniques. He's motivated to progress. Patient is able to dress himself (spouse lays out his clothing). Spouse minimally assists with showers. Patient is incontinent of bladder (Depends). Hoping to have some assist with paying for these though [employer].  Patient able to feed himself. H/O coughing/choking of foods now improved with techniques (focus on eating slowly/chewing foods well) learned from past Speech Therapy consult. Patient states these episodes occur qd to about 2x/week.  3. Family / Community Supports: Patient and spouse have children from previous marriages, total of 4. Patient's sister Dennie Bible Billie Lade) and daughter Marcy Panning) help as they can, but they each are dealing with health concerns of their own. Spouse feels fatigued most of the time. Involved in [employer]. PT/OT via [facility]. Steward Drone leaves patient in the home by himself for short periods of time. She dislikes having to do this but there is really no choice. She tried to time her grocery shopping etc to times when patient is settled.   4. Follow up Palliative Care Visit: let scheduler know to place on shirley's   I spent 60 minutes providing  this consultation from 3pm-4pm. More than 50% of the time in this consultation was spent coordinating communication.   HISTORY OF PRESENT ILLNESS:  Petro Talent is a 74 y.o. 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   CODE STATUS: full code  PPS: 50%  HOSPICE ELIGIBILITY/DIAGNOSIS: TBD  PAST MEDICAL HISTORY:  Past Medical History:  Diagnosis Date  . Abnormality of gait    uses cane at home/  walker and or scooter for longer distance  . Allergic rhinitis, cause unspecified   . BPH (benign prostatic hypertrophy)   . Claustrophobia   . Depression   . ED (erectile dysfunction)   . Glaucoma of both eyes   . Lower urinary tract symptoms (LUTS)   . Memory loss    mild  . Microscopic hematuria   . Mixed hyperlipidemia   . 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 Sebaceous cyst    left inguinal  . Severe dysarthria    speech --  w/ mild hypophonic  . Unspecified essential hypertension   . Wears glasses     SOCIAL HX:  Social History   Tobacco Use  . Smoking status: Never Smoker  . Smokeless tobacco: Never Used  Substance Use Topics  . Alcohol use: No    Alcohol/week: 0.0 standard drinks    ALLERGIES: No Known Allergies   PERTINENT MEDICATIONS:  Outpatient Encounter Medications as of 10/01/2019  Medication Sig  . aspirin 81 MG tablet Take 81 mg by mouth daily.  . cloNIDine (CATAPRES) 0.1 MG tablet Take 0.1 mg by mouth 2 (two) times daily.  Marland Kitchen ezetimibe (ZETIA) 10 MG tablet Take 10 mg by mouth daily.  . fenofibrate (TRICOR) 145 MG tablet Take 145 mg by mouth daily.  . hydrALAZINE (APRESOLINE) 50 MG tablet Take 50 mg by mouth 3 (three) times daily.  . irbesartan (AVAPRO) 300 MG tablet Take 300 mg daily by mouth.  . metoprolol succinate (TOPROL-XL) 100 MG 24 hr tablet Take 100 mg by mouth daily. qam  . metoprolol tartrate (LOPRESSOR) 50 MG tablet Take 50 mg by mouth 2 (two) times daily.  . QUEtiapine  (SEROQUEL) 25 MG tablet Take 25 mg by mouth at bedtime.  . Travoprost, BAK Free, (TRAVATAN) 0.004 % SOLN ophthalmic solution Place 1 drop into both eyes at bedtime.  . [DISCONTINUED] TERAZOSIN HCL PO Take by mouth.   No facility-administered encounter medications on file as of 10/01/2019.    GENERAL  EXAM:   Well nourished, pleasantly conversant in short sentences. Speech a little halting and difficult to understand. Difficulty with writing. Engaged in conversation. Spouse Steward Drone in attendance.  Anselm Lis, NP  [phone]

## 2019-10-02 ENCOUNTER — Telehealth: Payer: Self-pay | Admitting: Internal Medicine

## 2019-10-02 NOTE — Telephone Encounter (Signed)
Spoke with wife and have scheduled a Palliative f/u visit for 10/25/19 @ 12:30 PM.

## 2019-10-25 ENCOUNTER — Other Ambulatory Visit: Payer: Medicare HMO | Admitting: Internal Medicine

## 2019-10-25 ENCOUNTER — Other Ambulatory Visit: Payer: Self-pay

## 2019-10-25 DIAGNOSIS — F0391 Unspecified dementia with behavioral disturbance: Secondary | ICD-10-CM

## 2019-10-25 DIAGNOSIS — Z515 Encounter for palliative care: Secondary | ICD-10-CM

## 2019-10-25 NOTE — Progress Notes (Signed)
Therapist, nutritional Palliative Care Consult Note Telephone: [phone]  Fax: [phone]  PATIENT NAME: Donald Stokes DOB: Feb 20, 1946 MRN: [id number]  PRIMARY CARE PROVIDER:   Ronnald Collum  REFERRING PROVIDER:  Ronnald Collum [facility]  [address] [place],  [place] [address]  RESPONSIBLE PARTY:   Wife   Donald Stokes [phone]    RECOMMENDATIONS and PLAN:  Palliative care encounter Z51.5  1.  Advance care planning:  MOST form previously completed.  Full scope of treatment and attempt CPR.  Document is in the home.                                              Goals remain unchanged. Improve mobility, independence with showering, preserve cognitive function.  Palliative care will f/u with patient in aprox. 6 weeks  2. 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 Hallucinations have lessened with use of Seroquel 25mg  which is now taken every other night. Night time sleep as improved as well.  Pending appt with neurologist to discuss initiation of Aricept.  Consider continuing use of Seroquel to maintain control of hallucinations and behaviors.    3. Need for safe environment: Reviewed fall risk prevention.  Continue use of rollator.  Install front door chime alarm to avoid elopement.   4.  Hypertension: Remains hypertensive.   Instructed on low sodium meals and seasonings(powders rather than salt), frozen or fresh veggies.  Avoid use of NSAIDs,  Compression socks and leg elevation to improve peripheral edema. Continue to monitor and record BP daily.  I spent 40 minutes providing this consultation,  from 1345 to 1425.  More than 50% of the time in this consultation was spent coordinating communication with pt and wife.   HISTORY OF PRESENT ILLNESS:  Follow-up with  [name]. Wife reports that patient has been sleeping better at night and pt reports decreased number and degree of hallucinations. Wife reports  no aggressiveness or agitation since beginning Seroquel.  No reports of falls.  Decreased appetite but maintaining weight.  He is able to assist with simple in home tasks. Palliative Care was asked to help address goals of care.   CODE STATUS: FULL CODE PPS: 50% HOSPICE ELIGIBILITY/DIAGNOSIS: TBD  PAST MEDICAL HISTORY:  Past Medical History:  Diagnosis Date  . Abnormality of gait    uses cane at home/  walker and or scooter for longer distance  . Allergic rhinitis, cause unspecified   . BPH (benign prostatic hypertrophy)   . Claustrophobia   . Depression   . ED (erectile dysfunction)   . Glaucoma of both eyes   . Lower urinary tract symptoms (LUTS)   . Memory loss    mild  . Microscopic hematuria   . Mixed hyperlipidemia   . 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 Sebaceous cyst    left inguinal  . Severe dysarthria    speech --  w/ mild hypophonic  . Unspecified essential hypertension   . Wears glasses     SOCIAL HX: Lives with wife   ALLERGIES: No Known Allergies   PERTINENT MEDICATIONS:  Outpatient Encounter Medications as of 10/25/2019  Medication Sig  . aspirin 81 MG tablet Take 81 mg by mouth daily.  10/27/2019  cloNIDine (CATAPRES) 0.1 MG tablet Take 0.1 mg by mouth 2 (two) times daily.  Marland Kitchen ezetimibe (ZETIA) 10 MG tablet Take 10 mg by mouth daily.  . fenofibrate (TRICOR) 145 MG tablet Take 145 mg by mouth daily.  . hydrALAZINE (APRESOLINE) 50 MG tablet Take 50 mg by mouth 3 (three) times daily.  . irbesartan (AVAPRO) 300 MG tablet Take 300 mg daily by mouth.  . metoprolol succinate (TOPROL-XL) 100 MG 24 hr tablet Take 100 mg by mouth daily. qam  . metoprolol tartrate (LOPRESSOR) 50 MG tablet Take 50 mg by mouth 2 (two) times daily.  . QUEtiapine (SEROQUEL) 25 MG tablet Take 25 mg by mouth at bedtime.  . Travoprost, BAK Free, (TRAVATAN) 0.004 % SOLN ophthalmic  solution Place 1 drop into both eyes at bedtime.   No facility-administered encounter medications on file as of 10/25/2019.    PHYSICAL EXAM:   General: NAD, chronically ill appearing elderly male walking with rollator Cardiovascular: regular rate and rhythm Pulmonary: unlabored respirations Abdomen: soft and rounded Extremities: 2+pitting edema BLE below knees to feet Skin: exposed skin is intact Neurological: Alert and oriented to person and place.  Ataxic gait with assistance of rollator.  Spastic movements of BUE Psych:  Calm and cooperative  Margaretha Sheffield, NP-C

## 2019-10-30 ENCOUNTER — Other Ambulatory Visit: Payer: Self-pay

## 2019-10-30 ENCOUNTER — Encounter (HOSPITAL_COMMUNITY): Payer: Self-pay | Admitting: *Deleted

## 2019-10-30 ENCOUNTER — Emergency Department (HOSPITAL_COMMUNITY): Payer: Medicare HMO

## 2019-10-30 ENCOUNTER — Emergency Department (HOSPITAL_COMMUNITY)
Admission: EM | Admit: 2019-10-30 | Discharge: 2019-10-31 | Disposition: A | Discharge status: Home / Self Care | Payer: Medicare HMO | Attending: Emergency Medicine | Admitting: Emergency Medicine

## 2019-10-30 DIAGNOSIS — I10 Essential (primary) hypertension: Secondary | ICD-10-CM

## 2019-10-30 DIAGNOSIS — Z7982 Long term (current) use of aspirin: Secondary | ICD-10-CM | POA: Insufficient documentation

## 2019-10-30 DIAGNOSIS — R030 Elevated blood-pressure reading, without diagnosis of hypertension: Secondary | ICD-10-CM | POA: Insufficient documentation

## 2019-10-30 DIAGNOSIS — Z6828 Body mass index (BMI) 28.0-28.9, adult: Secondary | ICD-10-CM | POA: Diagnosis not present

## 2019-10-30 DIAGNOSIS — R791 Abnormal coagulation profile: Secondary | ICD-10-CM | POA: Insufficient documentation

## 2019-10-30 DIAGNOSIS — R0602 Shortness of breath: Secondary | ICD-10-CM | POA: Insufficient documentation

## 2019-10-30 DIAGNOSIS — E669 Obesity, unspecified: Secondary | ICD-10-CM | POA: Diagnosis not present

## 2019-10-30 DIAGNOSIS — Z20822 Contact with and (suspected) exposure to covid-19: Secondary | ICD-10-CM | POA: Diagnosis not present

## 2019-10-30 DIAGNOSIS — R0600 Dyspnea, unspecified: Secondary | ICD-10-CM

## 2019-10-30 DIAGNOSIS — R7989 Other specified abnormal findings of blood chemistry: Secondary | ICD-10-CM

## 2019-10-30 LAB — CBC
HCT: 44.8 % (ref 39.0–52.0)
Hemoglobin: 14.2 g/dL (ref 13.0–17.0)
MCH: 28.1 pg (ref 26.0–34.0)
MCHC: 31.7 g/dL (ref 30.0–36.0)
MCV: 88.7 fL (ref 80.0–100.0)
Platelets: 307 10*3/uL (ref 150–400)
RBC: 5.05 MIL/uL (ref 4.22–5.81)
RDW: 14.9 % (ref 11.5–15.5)
WBC: 10.2 10*3/uL (ref 4.0–10.5)
nRBC: 0 % (ref 0.0–0.2)

## 2019-10-30 LAB — BASIC METABOLIC PANEL
Anion gap: 9 (ref 5–15)
BUN: 10 mg/dL (ref 8–23)
CO2: 28 mmol/L (ref 22–32)
Calcium: 9.3 mg/dL (ref 8.9–10.3)
Chloride: 103 mmol/L (ref 98–111)
Creatinine, Ser: 0.77 mg/dL (ref 0.61–1.24)
GFR calc Af Amer: >60 mL/min (ref >60)
GFR calc non Af Amer: >60 mL/min (ref >60)
Glucose, Bld: 105 mg/dL — ABNORMAL HIGH (ref 70–99)
Potassium: 3.7 mmol/L (ref 3.5–5.1)
Sodium: 140 mmol/L (ref 135–145)

## 2019-10-30 LAB — D-DIMER, QUANTITATIVE: D-Dimer, Quant: 2.42 ug/mL-FEU — ABNORMAL HIGH (ref 0.00–0.50)

## 2019-10-30 LAB — SARS CORONAVIRUS 2 BY RT PCR (HOSPITAL ORDER, PERFORMED IN ~~LOC~~ HOSPITAL LAB): SARS Coronavirus 2: NEGATIVE

## 2019-10-30 NOTE — ED Triage Notes (Signed)
Pt states that he has been short of breath for one day. Hurting all over. Has had a cough. Denies fevers. Per patient spouse, pt has dementia and has recently been started on Seroquel. Pt did have a fall today, did not hit his head.

## 2019-10-30 NOTE — ED Provider Notes (Signed)
[facility]-EMERGENCY DEPT Provider Note   CSN: [id number] Arrival date & time: 10/30/19  1927     History Chief Complaint  Patient presents with  . Shortness of Breath    Donald Stokes is a 74 y.o. male.  HPI Patient is here with his wife for evaluation of shortness of breath.  His wife gives most of the history.  His wife feels like the shortness of breath started today.  He has not been coughing or had a fever.  He is eating normally.  He has recently had his first dose of Aricept, for dementia symptoms.  He also started physical therapy, at his home today.  Prior to that he had been receiving occupational therapy, to help with his cerebellar disease.  Patient has not had a Covid vaccine.  He and his wife rarely go out of the house.  No other recent illnesses.  There are no other known modifying factors.    Past Medical History:  Diagnosis Date  . Abnormality of gait    uses cane at home/  walker and or scooter for longer distance  . Allergic rhinitis, cause unspecified   . BPH (benign prostatic hypertrophy)   . Claustrophobia   . Depression   . ED (erectile dysfunction)   . Glaucoma of both eyes   . Lower urinary tract symptoms (LUTS)   . Memory loss    mild  . Microscopic hematuria   . Mixed hyperlipidemia   . 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 Sebaceous cyst    left inguinal  . Severe dysarthria    speech --  w/ mild hypophonic  . Unspecified essential hypertension   . Wears glasses     Patient Active Problem List   Diagnosis Date Noted  . 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 CYSTOSCOPY N/A 12/26/2014   Procedure: CYSTOSCOPY;  Surgeon: Malen Gauze, MD;  Location: [facility];  Service: Urology;  Laterality: N/A;  . NEGATIVE SLEEP  STUDY  April 2016--  per pt  . NO PAST SURGERIES    . SCROTAL EXPLORATION Left 12/26/2014   Procedure: LEFT INGUINAL SEBACEOUS CYST EXCISION;  Surgeon: Malen Gauze, MD;  Location: [facility];  Service: Urology;  Laterality: Left;       Family History  Problem Relation Age of Onset  . Cancer Mother   . Heart failure Father   . Multiple sclerosis Son   . Stroke Maternal Grandmother   . Heart failure Maternal Uncle     Social History   Tobacco Use  . Smoking status: Never Smoker  . Smokeless tobacco: Never Used  Substance Use Topics  . Alcohol use: No    Alcohol/week: 0.0 standard drinks  . Drug use: No    Home Medications Prior to Admission medications   Medication Sig Start Date End Date Taking? Authorizing Provider  aspirin 81 MG tablet Take 81 mg by mouth daily.   Yes [provider]  cloNIDine (CATAPRES) 0.1 MG tablet Take 0.1 mg by mouth 2 (two) times daily.   Yes [provider]  donepezil (ARICEPT) 5 MG tablet Take 5 mg by mouth at bedtime.   Yes [provider]  ezetimibe (ZETIA) 10 MG tablet Take 10 mg by mouth daily.   Yes [provider]  fenofibrate (TRICOR) 145 MG tablet Take 145 mg by mouth daily.   Yes [provider]  fluticasone (FLONASE) 50 MCG/ACT nasal spray Place 1-2 sprays into both nostrils daily as needed for allergies or rhinitis.   Yes [provider]  hydrALAZINE (APRESOLINE) 50 MG tablet Take 50 mg by mouth 3 (three) times daily.   Yes [provider]  irbesartan (AVAPRO) 300 MG tablet Take 300 mg daily by mouth. 01/03/17  Yes [provider]  metoprolol succinate (TOPROL-XL) 100 MG 24 hr tablet Take 100 mg by mouth in the morning.    Yes [provider]  metoprolol succinate (TOPROL-XL) 50 MG 24 hr tablet Take 50 mg by mouth every evening. Take with or immediately following a meal.   Yes [provider]  QUEtiapine (SEROQUEL) 25 MG tablet  Take 25 mg by mouth at bedtime.   Yes [provider]  RESTASIS 0.05 % ophthalmic emulsion Place 1 drop into both eyes in the morning and at bedtime. 07/19/19  Yes [provider]  Travoprost, BAK Free, (TRAVATAN) 0.004 % SOLN ophthalmic solution Place 1 drop into both eyes at bedtime.   Yes [provider]    Allergies    Patient has no known allergies.  Review of Systems   Review of Systems  All other systems reviewed and are negative.   Physical Exam Updated Vital Signs BP (!) 171/97 (BP Location: Left Arm)   Pulse 66   Temp 97.8 F (36.6 C) (Oral)   Resp 20   Ht 5\' 11"  (1.803 m)   Wt 92.5 kg   SpO2 99%   BMI 28.45 kg/m   Physical Exam Vitals and nursing note reviewed.  Constitutional:      General: He is not in acute distress.    Appearance: He is well-developed. He is obese. He is not ill-appearing, toxic-appearing or diaphoretic.  HENT:     Head: Normocephalic and atraumatic.     Right Ear: External ear normal.     Left Ear: External ear normal.  Eyes:     Conjunctiva/sclera: Conjunctivae normal.     Pupils: Pupils are equal, round, and reactive to light.  Neck:     Trachea: Phonation normal.  Cardiovascular:     Rate and Rhythm: Normal rate and regular rhythm.     Heart sounds: Normal heart sounds.  Pulmonary:     Effort: Pulmonary effort is normal. No respiratory distress.     Breath sounds: Normal breath sounds. No stridor. No rhonchi.  Abdominal:     Palpations: Abdomen is soft.     Tenderness: There is no abdominal tenderness.  Musculoskeletal:        General: Normal range of motion.     Cervical back: Normal range of motion and neck supple.  Skin:    General: Skin is warm and dry.  Neurological:     Mental Status: He is alert and oriented to person, place, and time.     Cranial Nerves: No cranial nerve deficit.     Sensory: No sensory deficit.     Motor: No abnormal muscle tone.     Coordination: Coordination normal.    Psychiatric:        Mood and Affect: Mood normal.        Behavior: Behavior normal.     ED Results / Procedures / Treatments   Labs (all labs ordered are listed, but only abnormal results are displayed) Labs Reviewed  BASIC METABOLIC PANEL - Abnormal;  Notable for the following components:      Result Value   Glucose, Bld 105 (*)    All other components within normal limits  D-DIMER, QUANTITATIVE (NOT AT [facility]) - Abnormal; Notable for the following components:   D-Dimer, Quant 2.42 (*)    All other components within normal limits  SARS CORONAVIRUS 2 BY RT PCR [facility] ORDER, PERFORMED IN [facility])  CBC    EKG EKG Interpretation  Date/Time:  Wednesday October 30 2019 19:55:37 EDT Ventricular Rate:  69 PR Interval:    QRS Duration: 87 QT Interval:  412 QTC Calculation: 442 R Axis:   -11 Text Interpretation: Sinus rhythm Probable left atrial enlargement Probable left ventricular hypertrophy Baseline wander in lead(s) V2 12 Lead; Mason-Likar since last tracing no significant change Confirmed by Mancel Bale [phone]) on 10/30/2019 9:41:02 PM   Radiology DG Chest Portable 1 View  Result Date: 10/30/2019 CLINICAL DATA:  Shortness of breath for 1 day.  Cough. EXAM: PORTABLE CHEST 1 VIEW COMPARISON:  Radiograph and CT 01/27/2018 FINDINGS: Lung volumes are low. Normal heart size for technique. Unchanged mediastinal contours. Mild streaky opacities at the left lung base. Possible trace pleural effusions. No pulmonary edema. No pneumothorax. No acute osseous abnormalities are seen. IMPRESSION: Low lung volumes with streaky left basilar opacities, favor atelectasis over pneumonia. Possible trace pleural effusions. Electronically Signed   By: Narda Rutherford M.D.   On: 10/30/2019 20:21    Procedures .Critical Care Performed by: Mancel Bale, MD Authorized by: Mancel Bale, MD   Critical care provider statement:    Critical care time (minutes):  35   Critical care  start time:  10/30/2019 9:10 PM   Critical care end time:  10/30/2019 11:11 PM   Critical care time was exclusive of:  Separately billable procedures and treating other patients   Critical care was necessary to treat or prevent imminent or life-threatening deterioration of the following conditions:  Respiratory failure   Critical care was time spent personally by me on the following activities:  Blood draw for specimens, development of treatment plan with patient or surrogate, discussions with consultants, evaluation of patient's response to treatment, examination of patient, obtaining history from patient or surrogate, ordering and performing treatments and interventions, ordering and review of laboratory studies, pulse oximetry, re-evaluation of patient's condition, review of old charts and ordering and review of radiographic studies   (including critical care time)  Medications Ordered in ED Medications - No data to display  ED Course  I have reviewed the triage vital signs and the nursing notes.  Pertinent labs & imaging results that were available during my care of the patient were reviewed by me and considered in my medical decision making (see chart for details).  Clinical Course as of Oct 29 2309  Wed Oct 30, 2019  2139 Per radiologist, nonspecific findings but most likely atelectasis.  DG Chest Portable 1 View [EW]  2306 Normal  Basic metabolic panel(!) [EW]  2308 Elevated  D-dimer, quantitative(!) [EW]  2308 Normal  CBC [EW]  2308 CTA ordered to rule out PE because of elevated D-dimer with no signs of heart failure or acute lung problems on chest x-ray imaging.   [EW]    Clinical Course User Index [EW] Mancel Bale, MD   MDM Rules/Calculators/A&P                           Patient Vitals for the past 24 hrs:  BP Temp Temp src Pulse Resp SpO2 Height Weight  10/30/19 2200 (!) 171/97 -- -- 66 20 99 % -- --  10/30/19 2156 (!) 144/85 -- -- 67 20 95 % -- --  10/30/19 2136  (!) 174/97 97.8 F (36.6 C) Oral 69 20 98 % -- --  10/30/19 1947 (!) 162/99 98.2 F (36.8 C) Oral 71 20 92 % 5\' 11"  (1.803 m) 92.5 kg    11:09 PM Reevaluation with update and discussion. After initial assessment and treatment, an updated evaluation reveals no change in clinical status, findings discussed with patient and wife, they understand he needs additional evaluation with CT imaging.   Medical Decision Making:  This patient is presenting for evaluation of shortness of breath, which does require a range of treatment options, and is a complaint that involves a high risk of morbidity and mortality. The differential diagnoses include pneumonia, Covid infection, metabolic disorder, blood loss. I decided to review old records, and in summary elderly male with history of neurologic disease, presenting with shortness of breath, he has not had a Covid vaccine.  No known sick contacts..  I obtained additional historical information from his wife at the bedside.  Clinical Laboratory Tests Ordered, included CBC, Metabolic panel, Urinalysis and D-dimer. Review indicates normal except elevated D-dimer. Radiologic Tests Ordered, included chest x-ray.  I independently Visualized: Radiographic images, which show no clear evidence for pneumonia or heart failure  Cardiac Monitor Tracing which shows normal sinus rhythm    Critical Interventions-clinical evaluation, laboratory testing, chest x-ray, CT angiogram to rule out PE, observation  After These Interventions, the Patient was reevaluated and was found to require evaluation for causes of shortness of breath.  Initial evaluation did not indicate acute cardiac disorder, apparent pneumonia or significant blood loss.  Elevated D-dimer requires evaluation for PE.  CRITICAL CARE-yes Performed by: Mancel Bale  Nursing Notes Reviewed/ Care Coordinated Applicable Imaging Reviewed Interpretation of Laboratory Data incorporated into ED  treatment  Plan-disposition by Dr. Mancel Bale following return of CT imaging.    Final Clinical Impression(s) / ED Diagnoses Final diagnoses:  Dyspnea, unspecified type  Elevated d-dimer    Rx / DC Orders ED Discharge Orders    None       Preston Fleeting, MD 10/30/19 2312

## 2019-10-31 ENCOUNTER — Ambulatory Visit (HOSPITAL_COMMUNITY): Admission: RE | Admit: 2019-10-31 | Payer: Medicare HMO | Source: Ambulatory Visit

## 2019-10-31 MED ORDER — METOPROLOL SUCCINATE ER 50 MG PO TB24: 50.0000 mg | ORAL_TABLET | Freq: Every evening | ORAL | Status: DC

## 2019-10-31 MED ORDER — DONEPEZIL HCL 5 MG PO TABS: 5.0000 mg | ORAL_TABLET | Freq: Every day | ORAL | Status: DC

## 2019-10-31 MED ORDER — HYDRALAZINE HCL 25 MG PO TABS: 50.0000 mg | ORAL_TABLET | Freq: Three times a day (TID) | ORAL | Status: DC

## 2019-10-31 MED ORDER — QUETIAPINE FUMARATE 50 MG PO TABS
25.0000 mg | ORAL_TABLET | Freq: Every day | ORAL | Status: DC
  Administered 2019-10-31: 25 mg via ORAL
  Filled 2019-10-31: qty 1

## 2019-10-31 MED ORDER — IRBESARTAN 300 MG PO TABS: 300.0000 mg | ORAL_TABLET | Freq: Every day | ORAL | Status: DC

## 2019-10-31 MED ORDER — EZETIMIBE 10 MG PO TABS: 10.0000 mg | ORAL_TABLET | Freq: Every day | ORAL | Status: DC

## 2019-10-31 MED ORDER — FENOFIBRATE 160 MG PO TABS: 160.0000 mg | ORAL_TABLET | Freq: Every day | ORAL | Status: DC

## 2019-10-31 MED ORDER — LATANOPROST 0.005 % OP SOLN
1.0000 [drp] | Freq: Every day | OPHTHALMIC | Status: DC
  Filled 2019-10-31: qty 2.5

## 2019-10-31 MED ORDER — METOPROLOL SUCCINATE ER 50 MG PO TB24: 100.0000 mg | ORAL_TABLET | Freq: Every morning | ORAL | Status: DC

## 2019-10-31 MED ORDER — ASPIRIN EC 81 MG PO TBEC: 81.0000 mg | DELAYED_RELEASE_TABLET | Freq: Every day | ORAL | Status: DC

## 2019-10-31 MED ORDER — CLONIDINE HCL 0.1 MG PO TABS
0.1000 mg | ORAL_TABLET | Freq: Two times a day (BID) | ORAL | Status: DC
  Administered 2019-10-31: 0.1 mg via ORAL
  Filled 2019-10-31: qty 1

## 2019-10-31 MED ORDER — CYCLOSPORINE 0.05 % OP EMUL
1.0000 [drp] | Freq: Two times a day (BID) | OPHTHALMIC | Status: DC
  Filled 2019-10-31: qty 1

## 2019-10-31 NOTE — Discharge Instructions (Signed)
The cause for your shortness of breath was not clear.  Unfortunately, we were unable to get an IV started to test you for blood clots in the lung.  If your symptoms are getting worse, return to the emergency department so we can reevaluate it.

## 2019-10-31 NOTE — ED Provider Notes (Signed)
Care assumed from Dr. Effie Shy, patient with shortness of breath and elevated D-dimer pending CT angiogram of the chest.  Unfortunately, IV access was unable to be obtained.  I did try ultrasound-guided IV, but was unable to get a line established.  On reviewing the patient's history and physical, it is felt that he is at a low enough risk that it is safe for discharge.  He is maintaining a good oxygen saturation on room air with normal heart rate.  Patient is advised to return should symptoms worsen.  Ultrasound ED Peripheral IV (Provider)  Date/Time: 10/31/2019 2:12 AM Performed by: Dione Booze, MD Authorized by: Dione Booze, MD   Procedure details:    Indications: multiple failed IV attempts     Skin Prep: chlorhexidine gluconate     Location: Left upper arm.   Angiocath:  20 G   Bedside Ultrasound Guided: Yes     Images: archived     Patient tolerated procedure without complications: Yes     Dressing applied: No   Comments:     Venous access obtained, but catheter fell out of veinw hile trying to secure it. Unable to get other venous access.     Dione Booze, MD 10/31/19 [phone]

## 2019-10-31 NOTE — ED Notes (Signed)
Patient had several RN's attempt to start IV. IV team RN came down and they were unsuccessful. MD notified of issue.

## 2019-11-19 ENCOUNTER — Other Ambulatory Visit: Payer: Self-pay

## 2019-11-19 ENCOUNTER — Other Ambulatory Visit: Payer: Medicare HMO | Admitting: Internal Medicine

## 2019-11-19 DIAGNOSIS — G238 Other specified degenerative diseases of basal ganglia: Secondary | ICD-10-CM

## 2019-11-19 DIAGNOSIS — Z515 Encounter for palliative care: Secondary | ICD-10-CM

## 2019-11-19 NOTE — Progress Notes (Signed)
Therapist, nutritional Palliative Care Consult Note Telephone: [phone]  Fax: [phone]  PATIENT NAME: Donald Stokes DOB: 03/27/1945 MRN: [id number]  PRIMARY CARE PROVIDER:   Ronnald Collum  REFERRING PROVIDER:  Ronnald Collum [facility]  [address] [place],  [place] [address]  RESPONSIBLE PARTY:   Wife   Donald, Stokes [phone]    RECOMMENDATIONS and PLAN:  Palliative care encounter Z51.5  1.  Advance care planning:  Full scope of treatment and attempt CPR.  Document is in the home.                                              Goals remain unchanged. Improve mobility, independence with showering, preserve cognitive function.  Palliative care will f/u with patient as needed per request of wife. She reports that she has increased knowledge of pt's needs and how to manage his overall health.    2. 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 Hallucinations are minimal and non-disturbing to patient. Continue nightly Aricept for cognitive stabilitiy.     3. Need for safe environment: Continue fall risk prevention.  Continue use of rollator and pending new mobilization device. Follow-up with neurologist as planned.  4.  Hypertension: Improved with 142/80 today. Peripheral edema has improved as well.  Continue leg elevation and use of compression socks as needed. . Continue to monitor and record BP daily.  I spent 40 minutes providing this consultation,  from 1315 to 1355.  More than 50% of the time in this consultation was spent coordinating communication with pt and wife.   HISTORY OF PRESENT ILLNESS:  Follow-up with  Donald Stokes. Wife reports improvement in patient's hallucinations and cognitive level since beginning Aricept nightly.  He has been weaned off of Seroquel.  He continues to ambulate with a walker in and out of home as tolerated.   No reports of falls. He normally sits on the floor to retrieve items  as needed to prevent falls and is able to arise to a standing position with assist from walker. Wife plans on increasing walking activities.  Palliative Care was asked to help address goals of care.   CODE STATUS: FULL CODE PPS: 50% HOSPICE ELIGIBILITY/DIAGNOSIS: TBD  PAST MEDICAL HISTORY:  Past Medical History:  Diagnosis Date  . Abnormality of gait    uses cane at home/  walker and or scooter for longer distance  . Allergic rhinitis, cause unspecified   . BPH (benign prostatic hypertrophy)   . Claustrophobia   . Depression   . ED (erectile dysfunction)   . Glaucoma of both eyes   . Lower urinary tract symptoms (LUTS)   . Memory loss    mild  . Microscopic hematuria   . Mixed hyperlipidemia   . 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 Sebaceous cyst    left inguinal  . Severe dysarthria    speech --  w/ mild hypophonic  . Unspecified essential hypertension   . Wears glasses     SOCIAL HX: Lives with wife   ALLERGIES: No Known Allergies   PERTINENT MEDICATIONS:  Outpatient Encounter Medications as of 10/25/2019  Medication Sig  . aspirin 81 MG tablet Take 81 mg by mouth daily.  Marland Kitchen  cloNIDine (CATAPRES) 0.1 MG tablet Take 0.1 mg by mouth 2 (two) times daily.  Marland Kitchen ezetimibe (ZETIA) 10 MG tablet Take 10 mg by mouth daily.  . fenofibrate (TRICOR) 145 MG tablet Take 145 mg by mouth daily.  . hydrALAZINE (APRESOLINE) 50 MG tablet Take 50 mg by mouth 3 (three) times daily.  . irbesartan (AVAPRO) 300 MG tablet Take 300 mg daily by mouth.  . metoprolol succinate (TOPROL-XL) 100 MG 24 hr tablet Take 100 mg by mouth daily. qam  . metoprolol tartrate (LOPRESSOR) 50 MG tablet Take 50 mg by mouth 2 (two) times daily.  . QUEtiapine (SEROQUEL) 25 MG tablet Take 25 mg by mouth at bedtime.  . Travoprost, BAK Free, (TRAVATAN) 0.004 % SOLN ophthalmic solution Place 1 drop into  both eyes at bedtime.   No facility-administered encounter medications on file as of 10/25/2019.    PHYSICAL EXAM:   General: NAD, chronically ill appearing elderly male walking with rollator Cardiovascular: regular rate and rhythm Pulmonary: clear and unlabored respirations Abdomen: soft and rounded Extremities: 2+pitting edema L ankle and trace in R andke; Skin: exposed skin is intact Neurological: Somnolent when riented to person and place.  Ataxic gait with assistance of rollator.  Spastic movements of BUE Psych:  Calm and cooperative  Margaretha Sheffield, NP-C

## 2019-12-16 ENCOUNTER — Emergency Department (HOSPITAL_COMMUNITY)
Admission: EM | Admit: 2019-12-16 | Discharge: 2019-12-16 | Disposition: A | Discharge status: Home / Self Care | Payer: Medicare HMO | Attending: Emergency Medicine | Admitting: Emergency Medicine

## 2019-12-16 ENCOUNTER — Other Ambulatory Visit: Payer: Self-pay

## 2019-12-16 DIAGNOSIS — Z960 Presence of urogenital implants: Secondary | ICD-10-CM | POA: Insufficient documentation

## 2019-12-16 DIAGNOSIS — Z7982 Long term (current) use of aspirin: Secondary | ICD-10-CM | POA: Diagnosis not present

## 2019-12-16 DIAGNOSIS — I10 Essential (primary) hypertension: Secondary | ICD-10-CM | POA: Insufficient documentation

## 2019-12-16 DIAGNOSIS — Z79899 Other long term (current) drug therapy: Secondary | ICD-10-CM | POA: Insufficient documentation

## 2019-12-16 DIAGNOSIS — R339 Retention of urine, unspecified: Secondary | ICD-10-CM | POA: Diagnosis not present

## 2019-12-16 DIAGNOSIS — R300 Dysuria: Secondary | ICD-10-CM | POA: Diagnosis present

## 2019-12-16 LAB — URINALYSIS, ROUTINE W REFLEX MICROSCOPIC
Bilirubin Urine: NEGATIVE
Glucose, UA: NEGATIVE mg/dL
Hgb urine dipstick: NEGATIVE
Ketones, ur: NEGATIVE mg/dL
Leukocytes,Ua: NEGATIVE
Nitrite: NEGATIVE
Protein, ur: NEGATIVE mg/dL
Specific Gravity, Urine: 1.015 (ref 1.005–1.030)
pH: 5 (ref 5.0–8.0)

## 2019-12-16 LAB — BASIC METABOLIC PANEL
Anion gap: 9 (ref 5–15)
BUN: 7 mg/dL — ABNORMAL LOW (ref 8–23)
CO2: 28 mmol/L (ref 22–32)
Calcium: 9.1 mg/dL (ref 8.9–10.3)
Chloride: 103 mmol/L (ref 98–111)
Creatinine, Ser: 0.7 mg/dL (ref 0.61–1.24)
GFR, Estimated: >60 mL/min (ref >60)
Glucose, Bld: 114 mg/dL — ABNORMAL HIGH (ref 70–99)
Potassium: 3.3 mmol/L — ABNORMAL LOW (ref 3.5–5.1)
Sodium: 140 mmol/L (ref 135–145)

## 2019-12-16 LAB — CBC
HCT: 43.1 % (ref 39.0–52.0)
Hemoglobin: 13.9 g/dL (ref 13.0–17.0)
MCH: 28 pg (ref 26.0–34.0)
MCHC: 32.3 g/dL (ref 30.0–36.0)
MCV: 86.9 fL (ref 80.0–100.0)
Platelets: 445 10*3/uL — ABNORMAL HIGH (ref 150–400)
RBC: 4.96 MIL/uL (ref 4.22–5.81)
RDW: 14.6 % (ref 11.5–15.5)
WBC: 8.1 10*3/uL (ref 4.0–10.5)
nRBC: 0 % (ref 0.0–0.2)

## 2019-12-16 NOTE — ED Provider Notes (Signed)
[facility]-EMERGENCY DEPT Provider Note   CSN: [id number] Arrival date & time: 12/16/19  1236     History Chief Complaint  Patient presents with   Dysuria    Donald Stokes is a 74 y.o. male.  HPI Patient presents with dysuria.  Has had around 2 weeks of symptoms.  States when he has to urinate that hurts.  Feels better after.  Has been urinating just small amounts to.  Denies testicle pain.  States the pain tends to be in his penis.  No flank pain.  No fevers or chills.  Had been on doxycycline through an urgent care.  They reportedly did a urinalysis but stated it was "clean".  Continued symptoms after 5 days of doxycycline so they discussed with PCP who started him on ciprofloxacin.  Continued symptoms and that was started on the seventh.  Patient is accompanied with his wife.  She is worried about prostate cancer but does not want him to know that that is what she is worried about.    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   neurologist-  dr Huston Foley ([facility])  pontine and cerebellar atrophy   Sebaceous cyst    left inguinal   Severe dysarthria    speech --  w/ mild hypophonic   Unspecified essential hypertension    Wears glasses     Patient Active Problem List   Diagnosis Date Noted   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    Past Surgical History:  Procedure Laterality Date   CYSTOSCOPY N/A 12/26/2014   Procedure: CYSTOSCOPY;  Surgeon: Malen Gauze, MD;  Location: [facility];  Service: Urology;  Laterality: N/A;   NEGATIVE SLEEP STUDY  April 2016--  per pt   NO PAST SURGERIES     SCROTAL EXPLORATION Left 12/26/2014   Procedure: LEFT INGUINAL SEBACEOUS CYST EXCISION;  Surgeon: Malen Gauze, MD;  Location: [facility];  Service: Urology;  Laterality: Left;       Family History  Problem Relation Age of Onset   Cancer Mother    Heart failure Father    Multiple sclerosis Son    Stroke Maternal Grandmother    Heart failure Maternal Uncle     Social History   Tobacco Use   Smoking status: Never Smoker   Smokeless tobacco: Never Used  Substance Use Topics   Alcohol use: No    Alcohol/week: 0.0 standard drinks   Drug use: No    Home Medications Prior to Admission medications   Medication Sig Start Date End Date Taking? Authorizing Provider  aspirin 81 MG tablet Take 81 mg by mouth daily.    [provider]  cloNIDine (CATAPRES) 0.1 MG tablet Take 0.1 mg by mouth 2 (two) times daily.    [provider]  donepezil (ARICEPT) 5 MG tablet Take 5 mg by mouth at bedtime.    [provider]  ezetimibe (ZETIA) 10 MG  tablet Take 10 mg by mouth daily.    [provider]  fenofibrate (TRICOR) 145 MG tablet Take 145 mg by mouth daily.    [provider]  fluticasone (FLONASE) 50 MCG/ACT nasal spray Place 1-2 sprays into both nostrils daily as needed for allergies or rhinitis.    [provider]  hydrALAZINE (APRESOLINE) 50 MG tablet Take 50 mg by mouth 3 (three) times daily.    [provider]  irbesartan (AVAPRO) 300 MG tablet Take 300 mg daily by mouth. 01/03/17   [provider]  metoprolol succinate (TOPROL-XL) 100 MG 24 hr tablet Take 100 mg by mouth in the morning.     [provider]  metoprolol succinate (TOPROL-XL) 50 MG 24 hr tablet Take 50 mg by mouth every evening. Take with or immediately following a meal.    [provider]  QUEtiapine (SEROQUEL) 25 MG tablet Take 25 mg by mouth at bedtime. Patient not taking: Reported on 11/20/2019    [provider]  RESTASIS 0.05 % ophthalmic emulsion Place 1 drop into both eyes in the morning and at bedtime. 07/19/19   [provider]  Travoprost, BAK Free, (TRAVATAN) 0.004 % SOLN ophthalmic solution Place 1 drop into both eyes at bedtime.    [provider]    Allergies    Patient has no known allergies.  Review of Systems   Review of Systems  Constitutional: Negative for appetite change and fever.  HENT: Negative for congestion.   Respiratory: Negative for shortness of breath.   Gastrointestinal: Negative for abdominal pain.  Genitourinary: Positive for decreased urine volume, difficulty urinating, penile pain and urgency. Negative for discharge, penile swelling and testicular pain.  Musculoskeletal: Negative for back pain.  Skin: Negative for rash.  Neurological: Negative for weakness.    Physical Exam Updated Vital Signs BP (!) 152/90    Pulse 78    Temp 98.2 F (36.8 C) (Oral)    Resp (!) 29    Ht 5\' 11"  (1.803 m)    Wt 83.9 kg    SpO2 97%    BMI 25.80 kg/m   Physical Exam Vitals and nursing note reviewed.  HENT:     Head: Normocephalic.  Eyes:     Pupils: Pupils are equal, round, and reactive to light.  Cardiovascular:     Rate and Rhythm: Regular rhythm.  Pulmonary:     Breath sounds: No wheezing or rhonchi.  Abdominal:     Comments: Has some suprapubic fullness.  Potential mass.  No CVA tenderness.  Genitourinary:    Comments: No CVA tenderness.  No penile discharge.  No testicular tenderness or mass.  No perineal tenderness. Musculoskeletal:        General: No tenderness.     Cervical back: Neck supple.  Skin:    General: Skin is warm.     Capillary Refill: Capillary refill takes less than 2 seconds.  Neurological:     Mental Status: He is alert.     Comments: Patient is awake and pleasant and at his  reported baseline.     ED Results / Procedures / Treatments   Labs (all labs ordered are listed, but only abnormal results are displayed) Labs Reviewed  BASIC METABOLIC PANEL - Abnormal; Notable for the following components:      Result Value   Potassium 3.3 (*)    Glucose, Bld 114 (*)    BUN 7 (*)    All other components within normal limits  CBC - Abnormal; Notable for the following components:   Platelets 445 (*)    All other components within normal limits  URINALYSIS, ROUTINE W REFLEX MICROSCOPIC    EKG None  Radiology No results found.  Procedures Procedures (including critical care time)  Medications Ordered in ED Medications - No data to display  ED Course  I have reviewed the triage vital signs and the nursing notes.  Pertinent labs & imaging results that were available during my care of the patient were reviewed by me and considered in my medical decision making (see chart for details).    MDM Rules/Calculators/A&P                          Patient with dysuria of also voiding small amounts.  Had post void residual of more than 500 cc.  Foley catheter placed and patient is feeling better.  Also has had weight loss which does make 1 worry about malignancy.  Will have patient follow-up with urology.  Urine does not show infection and kidney function good on lab work. Final Clinical Impression(s) / ED Diagnoses Final diagnoses:  Urinary retention    Rx / DC Orders ED Discharge Orders    None       Benjiman Core, MD 12/16/19 2029

## 2019-12-16 NOTE — ED Triage Notes (Signed)
Patient c/o dysuria for 2 weeks. Pt stated his PCP prescribed antibiotics 5 days ago but no relief. Denies fever, n/v and diarrhea.

## 2019-12-16 NOTE — Discharge Instructions (Signed)
Follow-up with [facility] for further management of the urinary retention.  Nursing will help you with instructions on how to use the catheter.

## 2020-07-19 IMAGING — CR DG CHEST 2V
2 series · 2 of 2 positions shown · non-contrast
Comparison: 01/12/2017

CLINICAL DATA: Unable to breathe for 2 days, history hypertension

EXAM:
CHEST - 2 VIEW

[w chest pa]
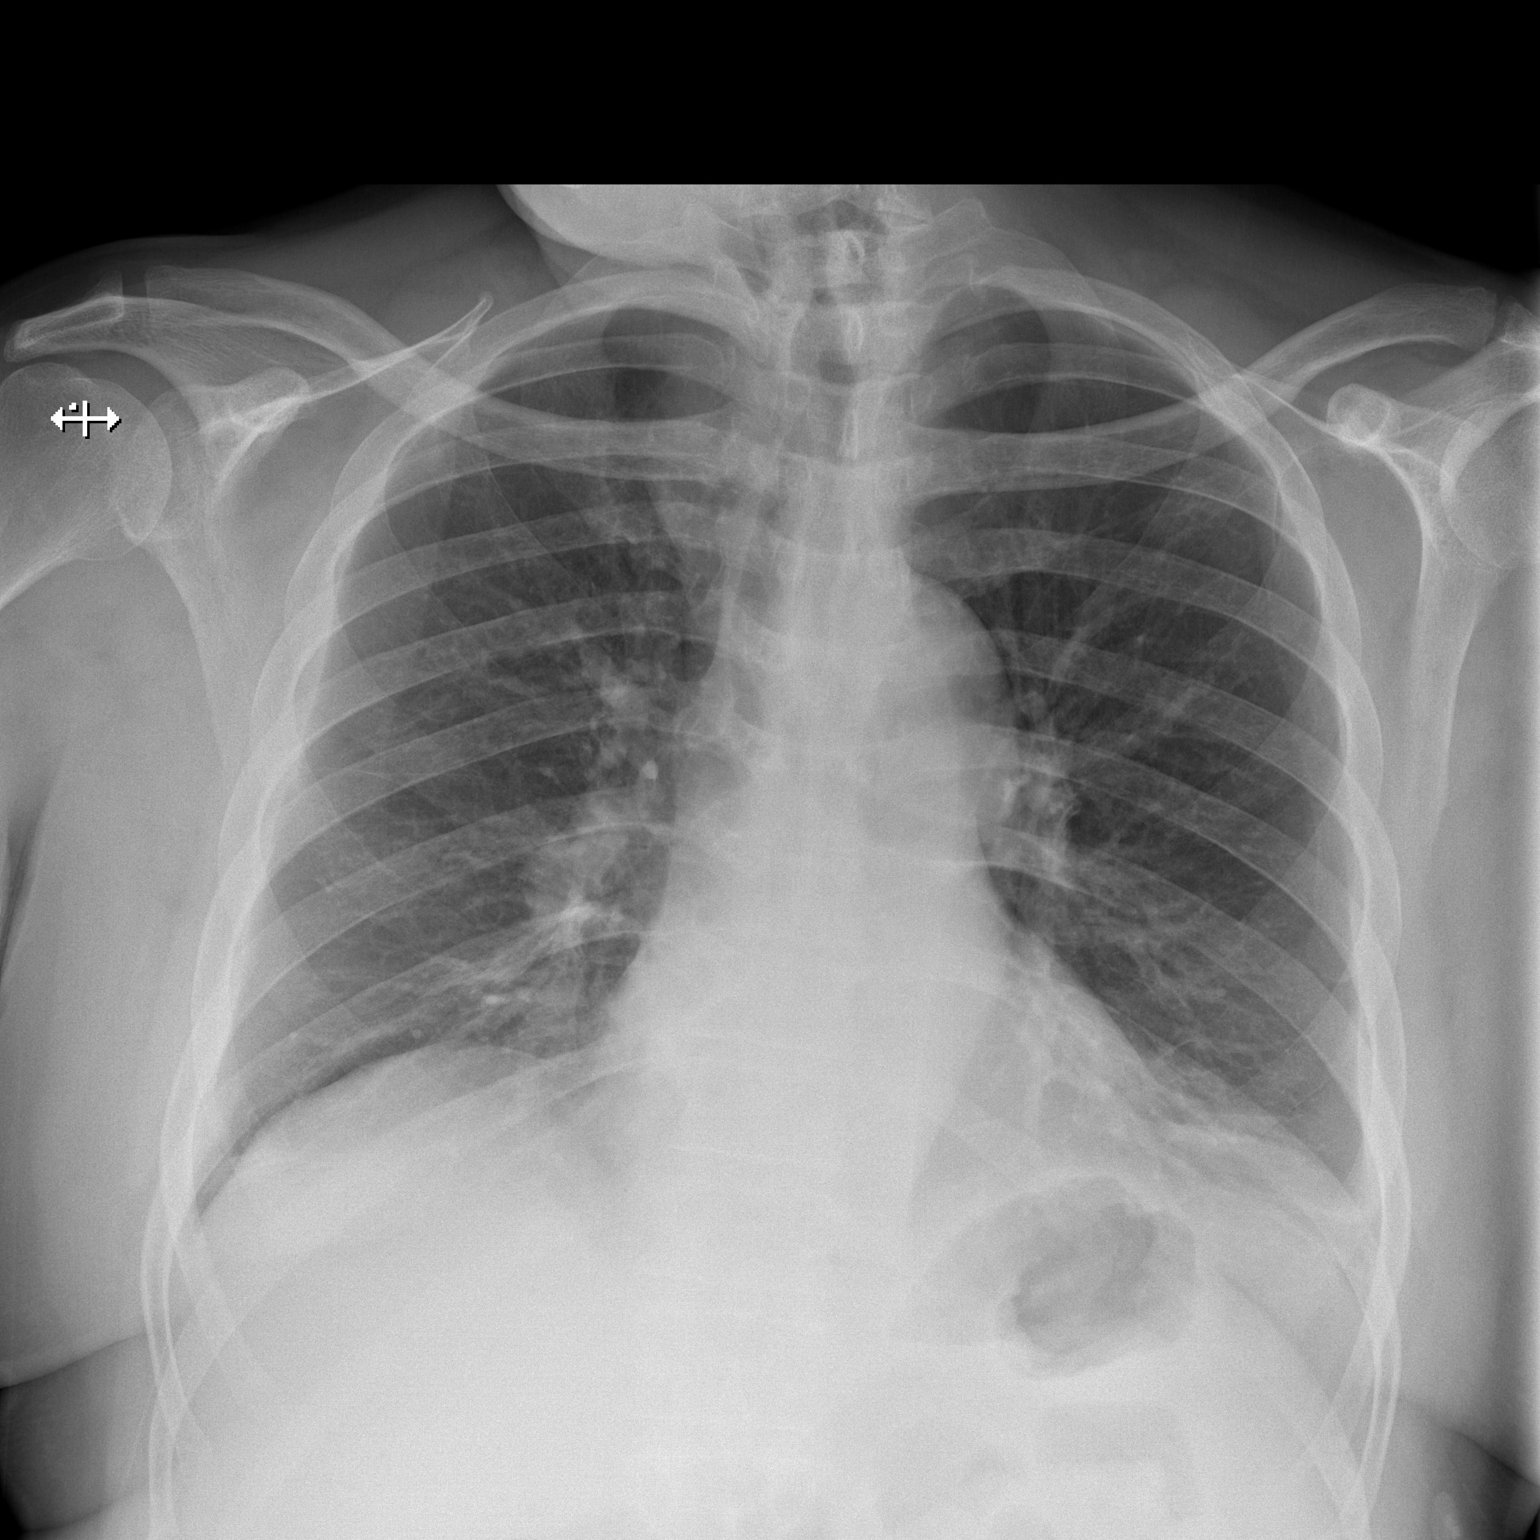

[w chest lat]
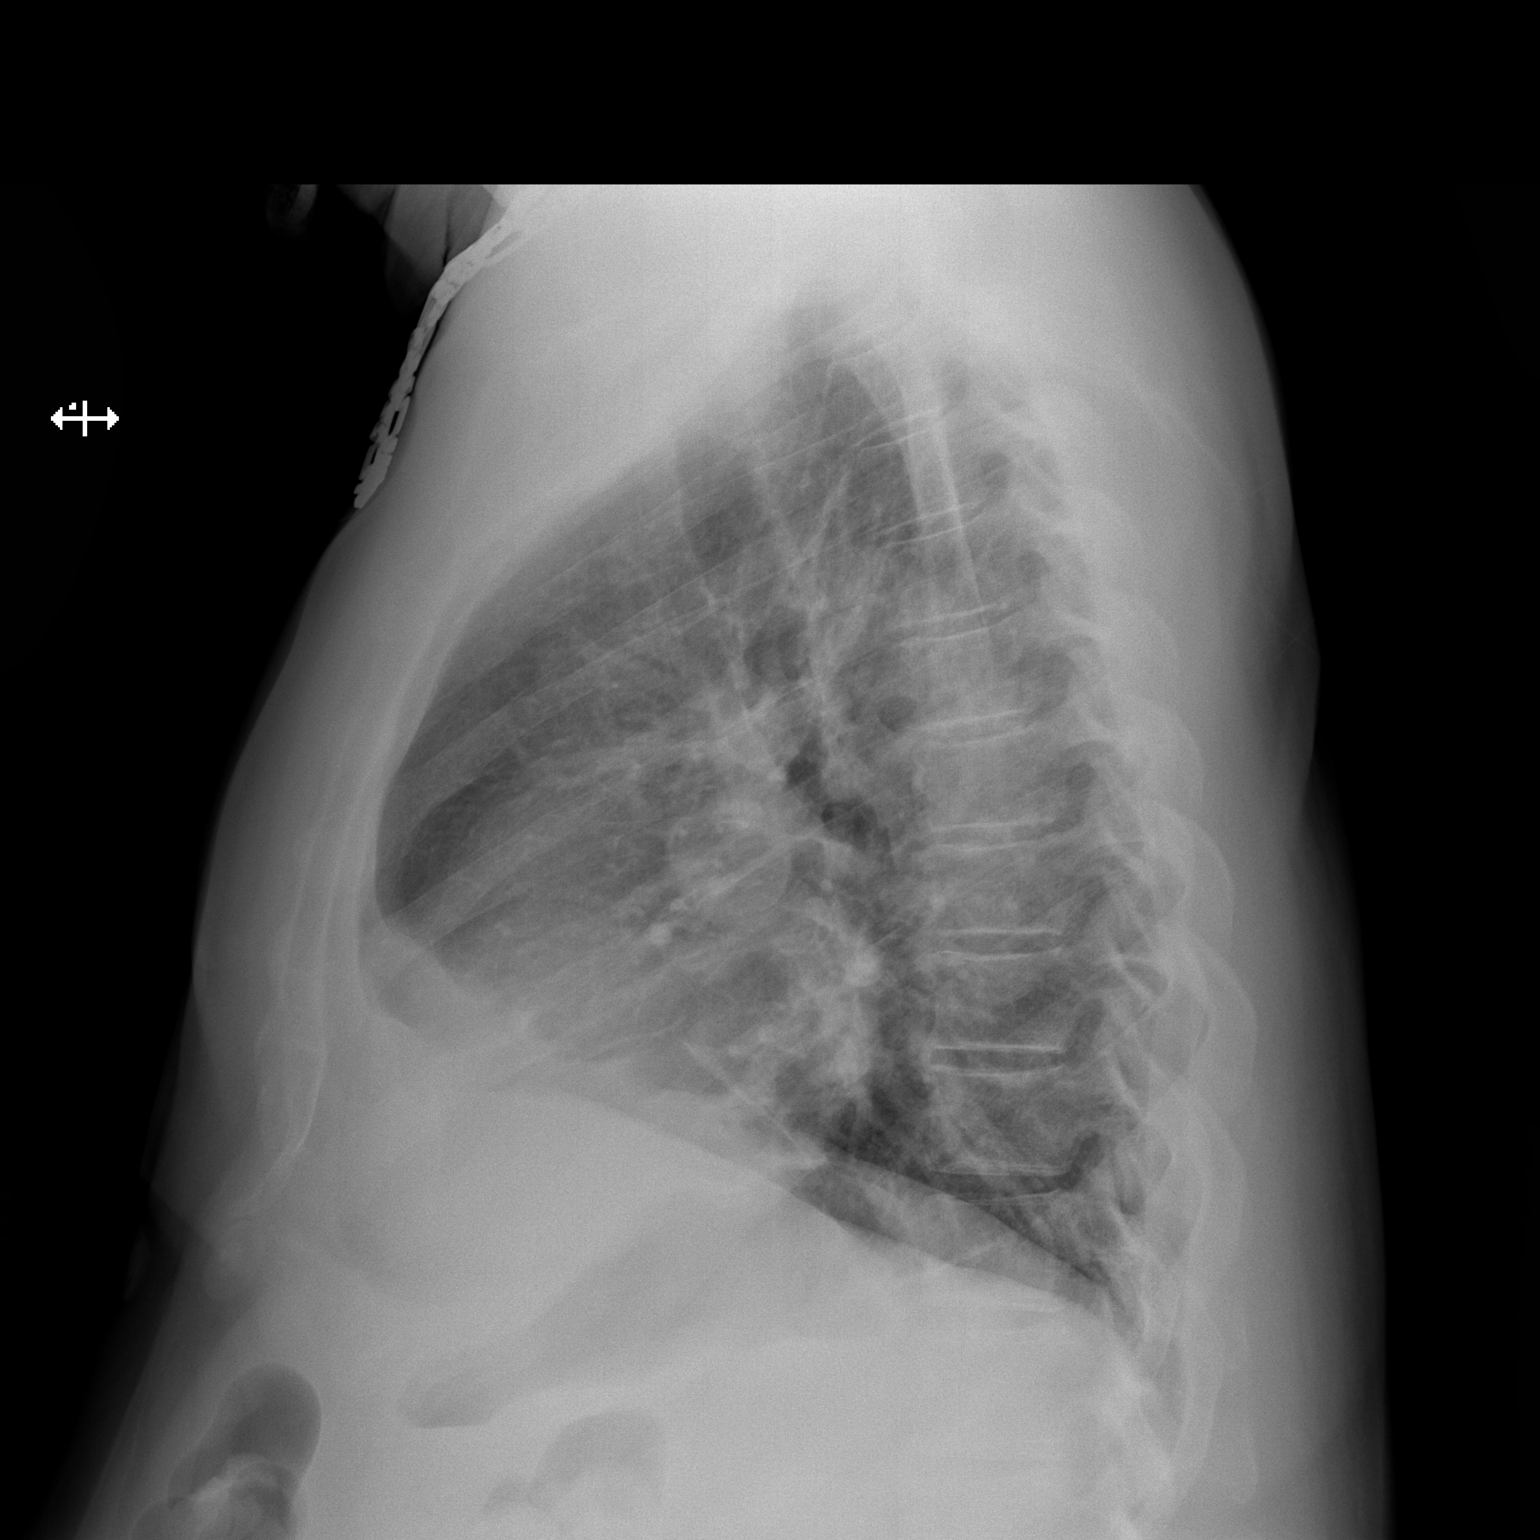

[2 of 2 positions shown; findings below may reference images not displayed]

FINDINGS: Normal heart size, mediastinal contours, and pulmonary vascularity.

Mild bibasilar atelectasis.

Remaining lungs clear.

No infiltrate, pleural effusion or pneumothorax.

Osseous structures unremarkable.
IMPRESSION: Bibasilar atelectasis.

## 2020-07-19 IMAGING — CT CT ANGIO CHEST
2 of 7 series · 18 of 46 positions shown · IV contrast (ISOVUE)
Comparison: None.

CLINICAL DATA: Shortness of breath, weakness, and chest pain.

EXAM:
CT ANGIOGRAPHY CHEST WITH CONTRAST
TECHNIQUE: Multidetector CT imaging of the chest was performed using the
standard protocol during bolus administration of intravenous
contrast. Multiplanar CT image reconstructions and MIPs were
obtained to evaluate the vascular anatomy.
CONTRAST:  100mL V48G47-OEL IOPAMIDOL (V48G47-OEL) INJECTION 76%

[Series 5: thins · axial · 0.73mm/px · z∈[-266,+7]mm · 15 of 311 slices shown]
[im 19/311  lung]
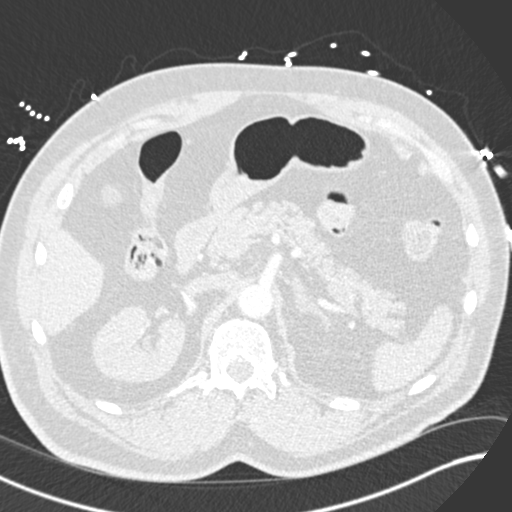
[im 37/311  soft-tissue]
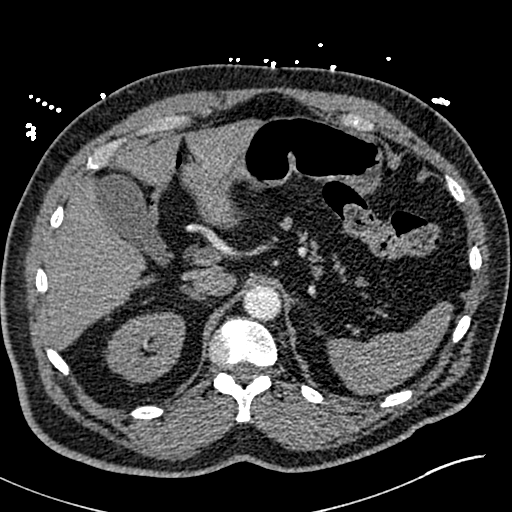
[im 55/311  lung]
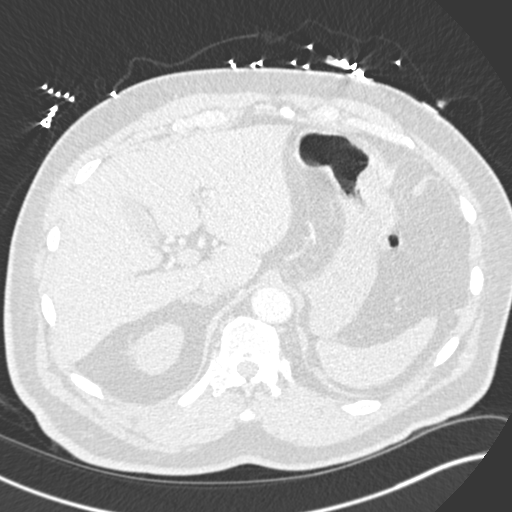
[im 73/311  soft-tissue]
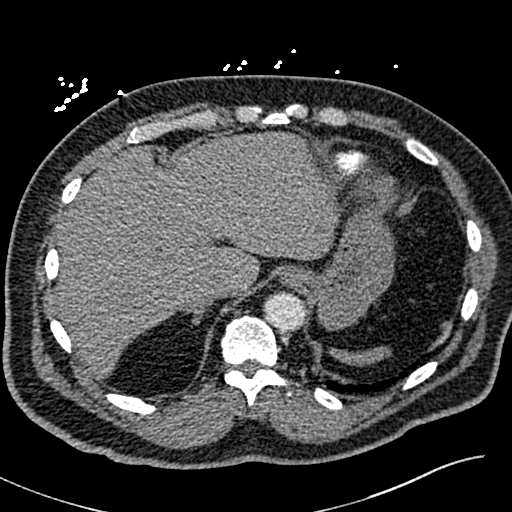
[im 92/311  lung]
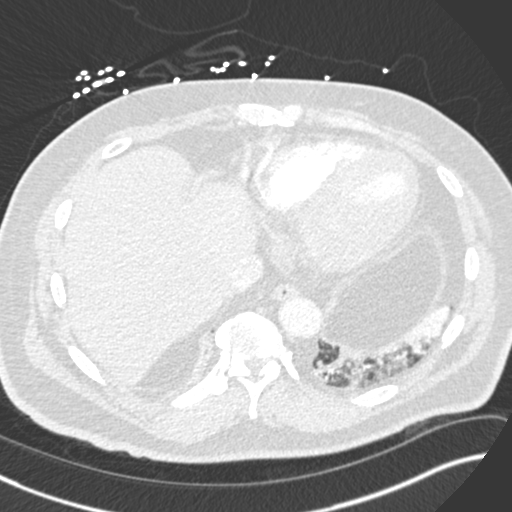
[im 110/311  soft-tissue]
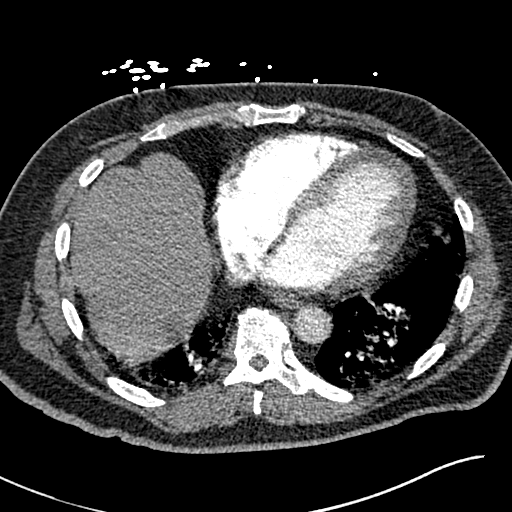
[im 128/311  lung]
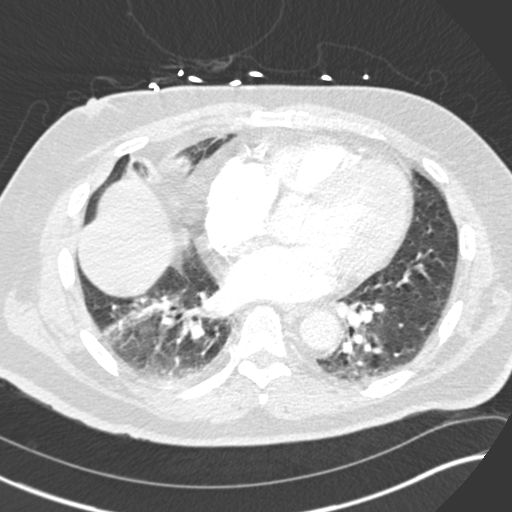
[im 165/311  soft-tissue]
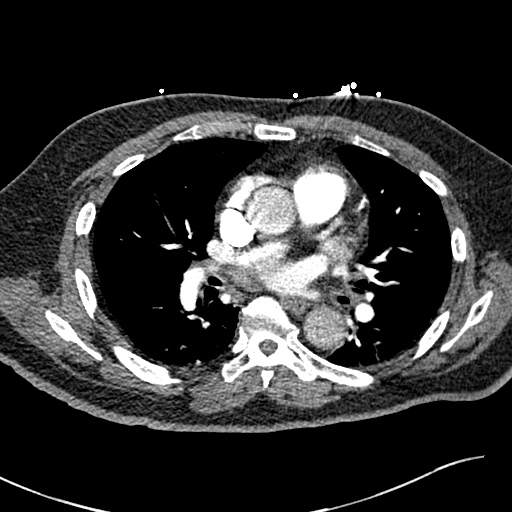
[im 183/311  lung]
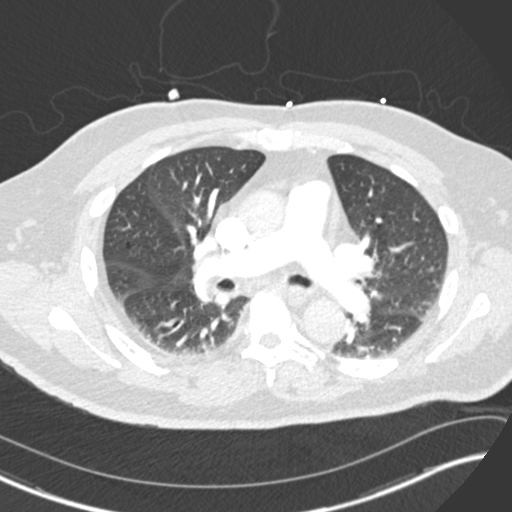
[im 201/311  soft-tissue]
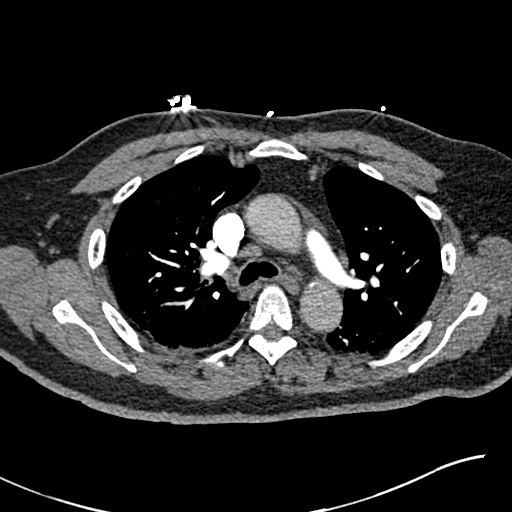
[im 219/311  lung]
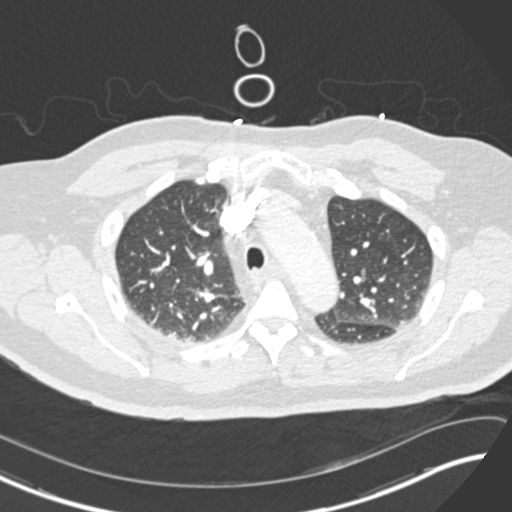
[im 238/311  soft-tissue]
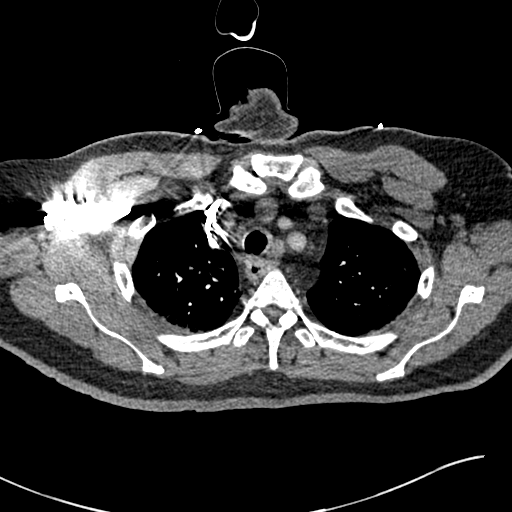
[im 256/311  lung]
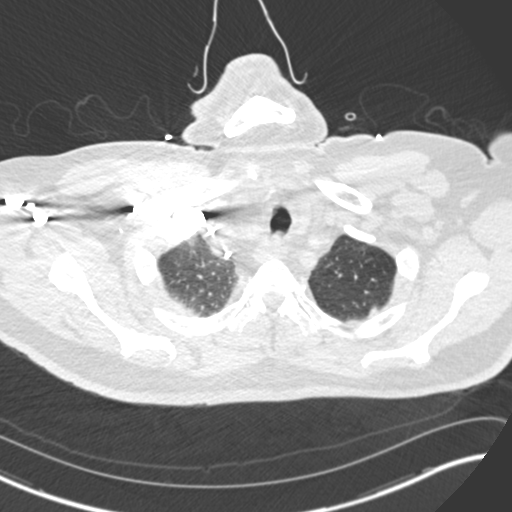
[im 274/311  soft-tissue]
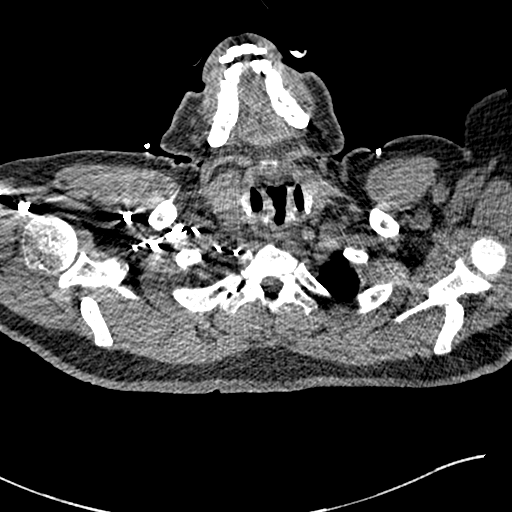
[im 292/311  lung]
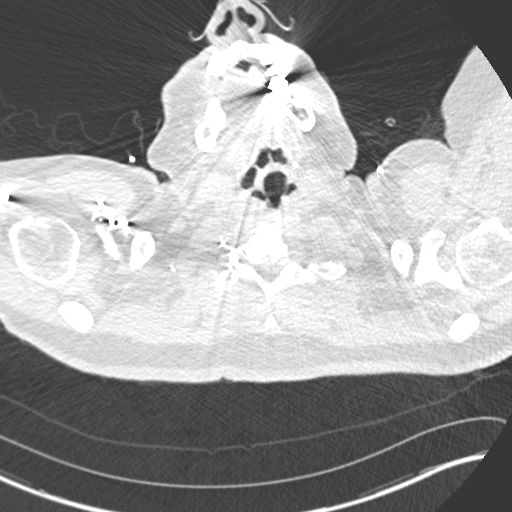

[Series 7: coronal mpr · coronal · 0.61mm/px · 3 of 151 slices shown]
[im 38/151  soft-tissue]
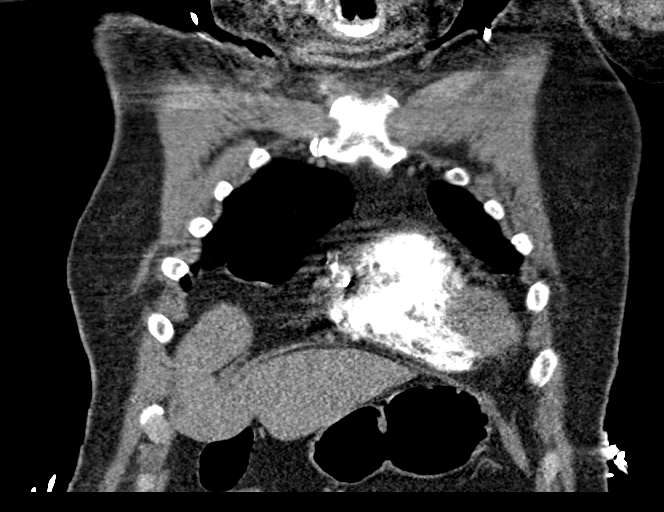
[im 76/151  soft-tissue]
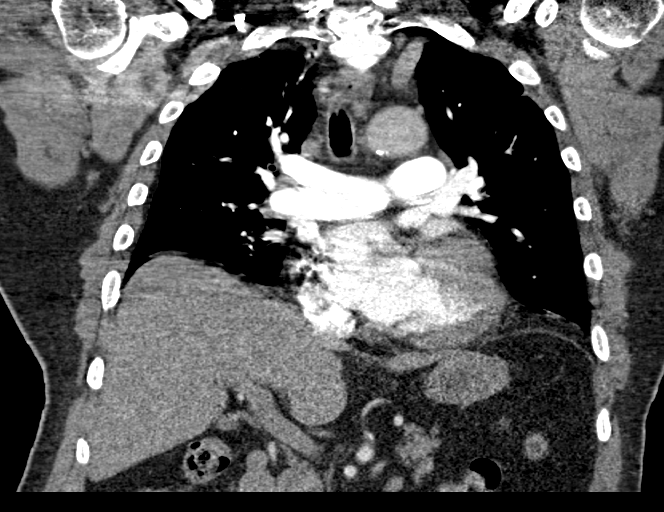
[im 113/151  soft-tissue]
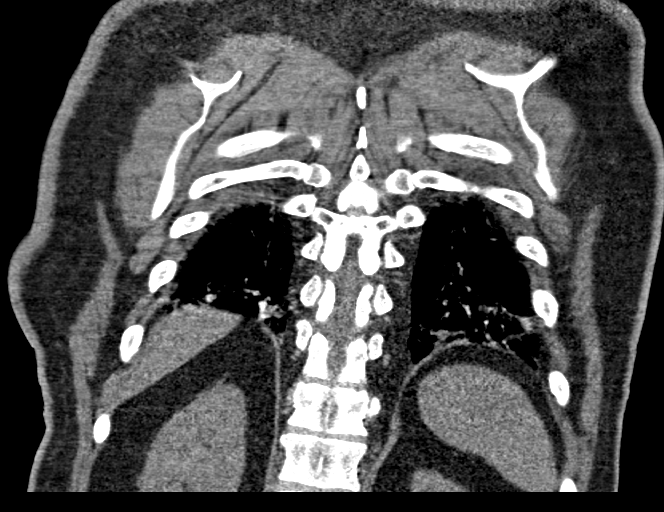

[18 of 46 positions shown; findings below may reference images not displayed]

FINDINGS: Cardiovascular: Motion artifact limits examination. There is good
opacification of the central and segmental pulmonary arteries. No
focal filling defects demonstrated. No evidence of significant
pulmonary embolus. Cardiac enlargement. No pericardial effusions.
Normal caliber thoracic aorta with few scattered calcifications.

Mediastinum/Nodes: Esophagus is decompressed. No significant
lymphadenopathy in the chest.

Lungs/Pleura: Motion artifact limits examination. Areas of
atelectasis in the lung bases. No airspace disease. Airways are
patent. No pleural effusions. No pneumothorax.

Upper Abdomen: No acute abnormalities identified.

Musculoskeletal: No chest wall abnormality. No acute or significant
osseous findings.

Review of the MIP images confirms the above findings.
IMPRESSION: No evidence of significant pulmonary embolus. No evidence of active
pulmonary disease. Atelectasis in the lung bases.

## 2020-07-19 IMAGING — CT CT HEAD W/O CM
3 series · 15 of 47 positions shown, 18 images · non-contrast
Comparison: MRI brain 07/30/2013

CLINICAL DATA: Focal neural deficit greater than 6 hours suspicious
for stroke.

EXAM:
CT HEAD WITHOUT CONTRAST
TECHNIQUE: Contiguous axial images were obtained from the base of the skull
through the vertex without intravenous contrast.

[Series 2: head wo · axial · 0.47mm/px · z∈[+1559,+1699]mm · 9 of 34 slices shown, 12 images]
[im 3/34  brain]
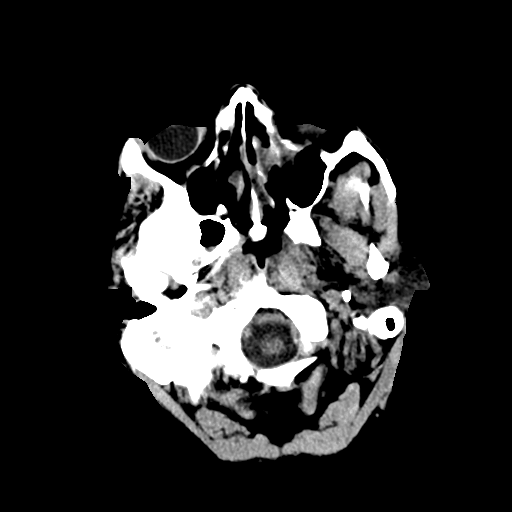
[im 3/34  bone]
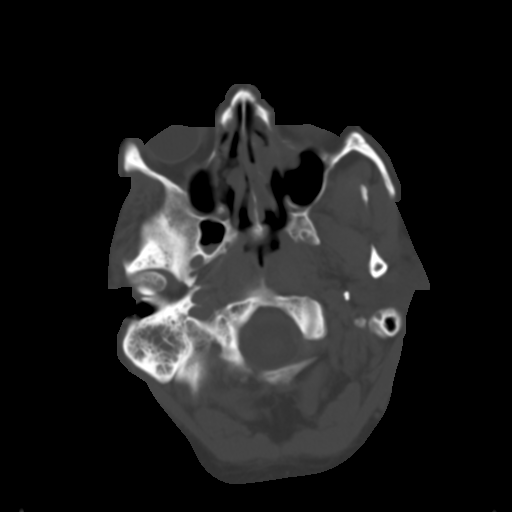
[im 6/34  brain]
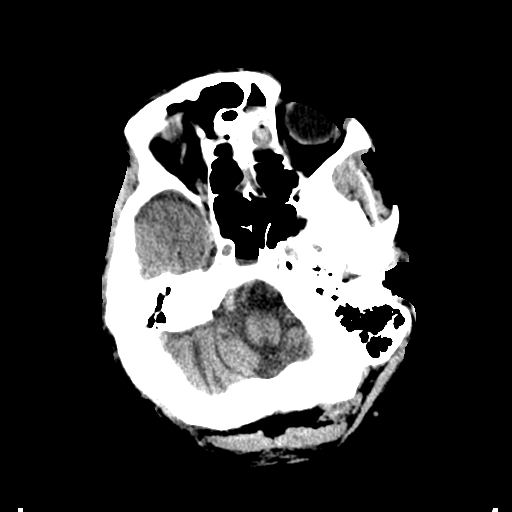
[im 10/34  brain]
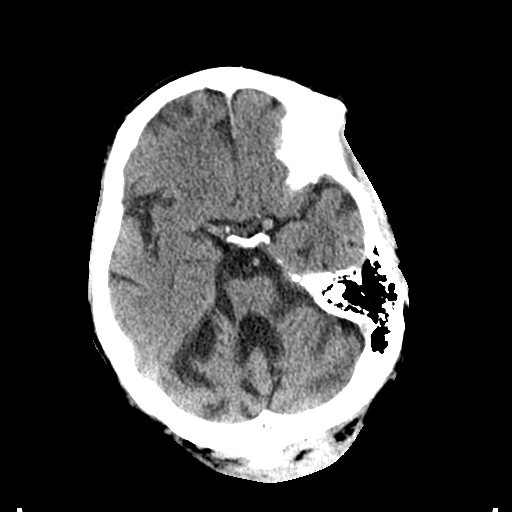
[im 13/34  brain]
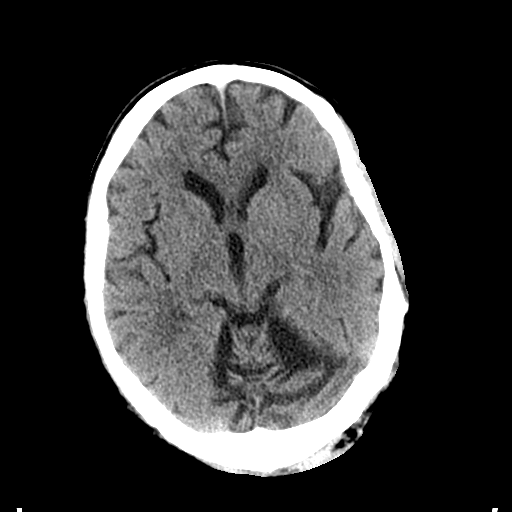
[im 18/34  brain]
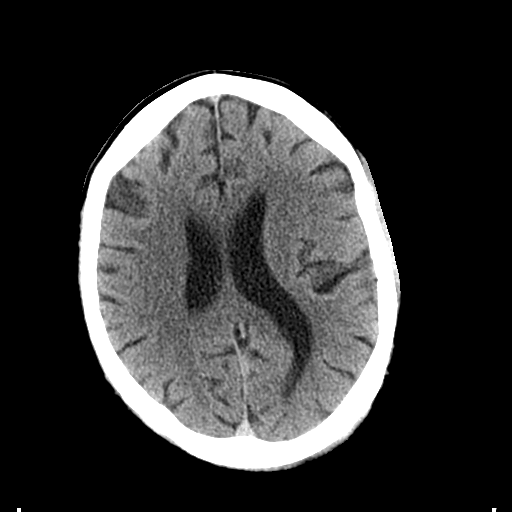
[im 18/34  bone]
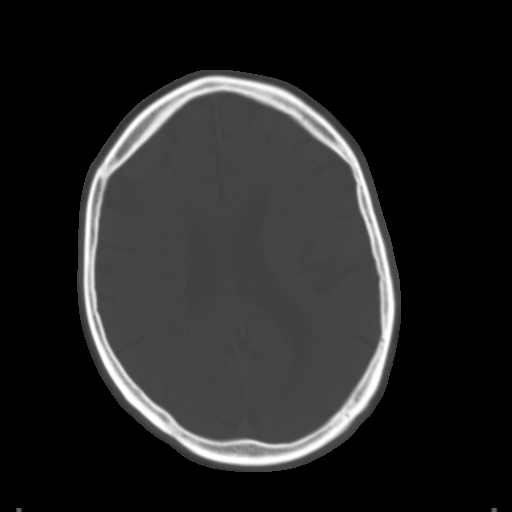
[im 21/34  brain]
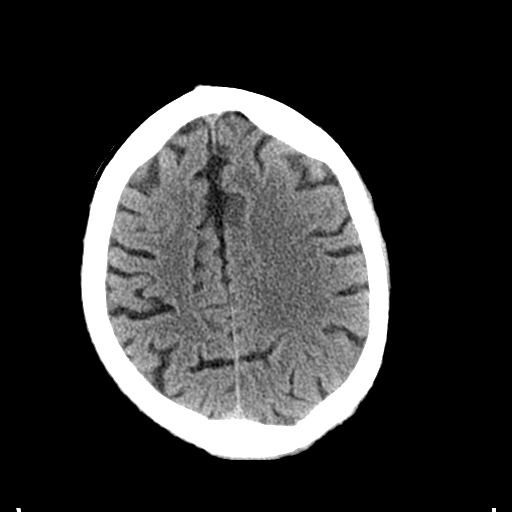
[im 24/34  brain]
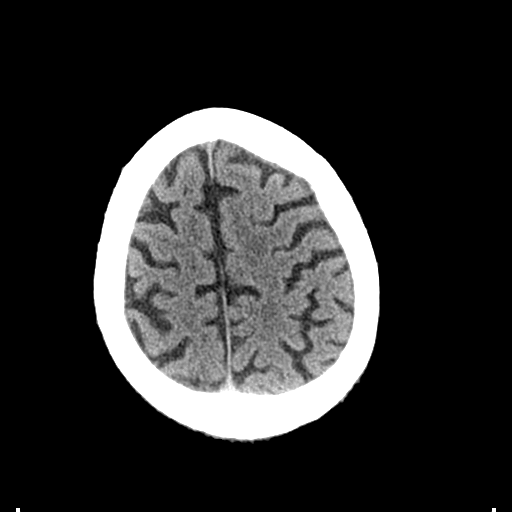
[im 28/34  brain]
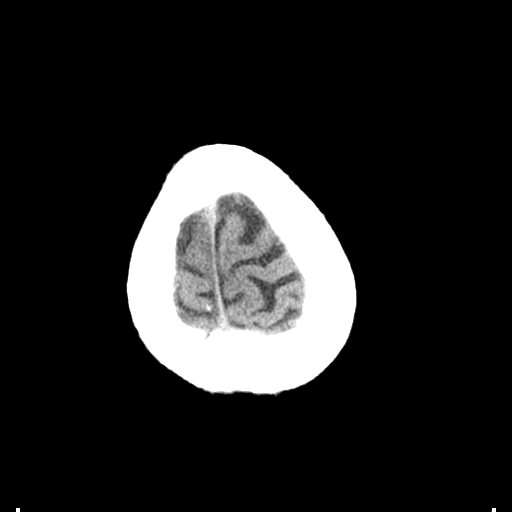
[im 31/34  brain]
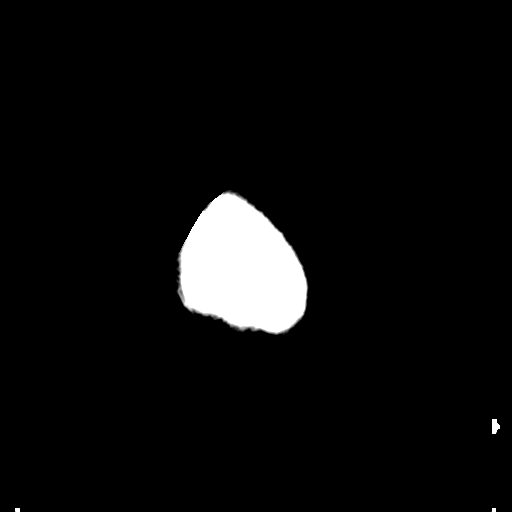
[im 31/34  bone]
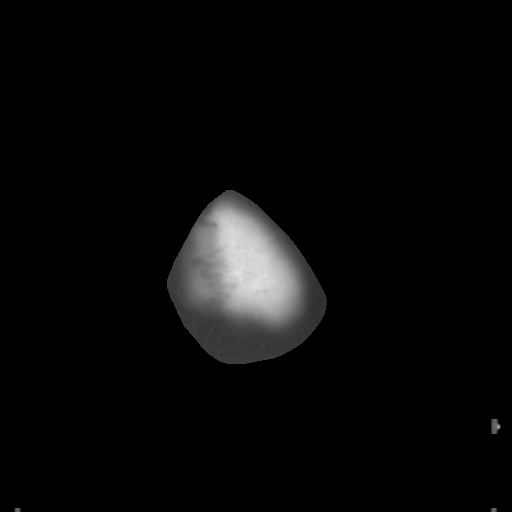

[Series 5: coronal soft tissue · coronal · 0.32mm/px · 3 of 84 slices shown]
[im 28/84  brain]
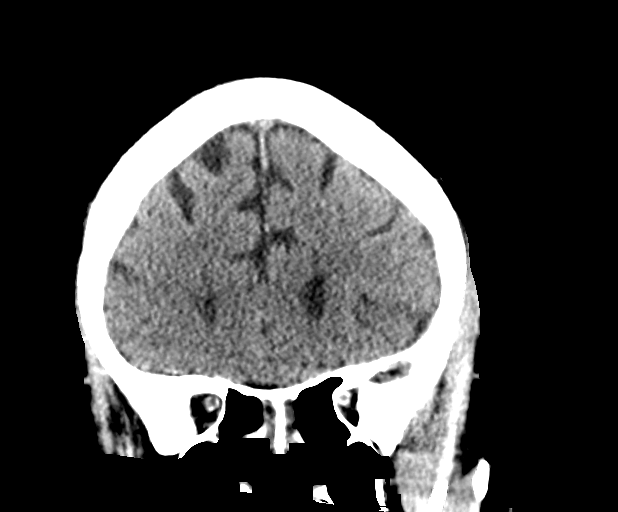
[im 37/84  brain]
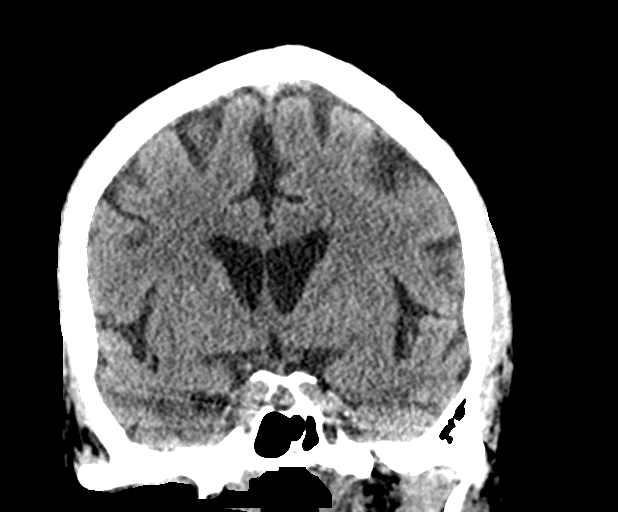
[im 47/84  brain]
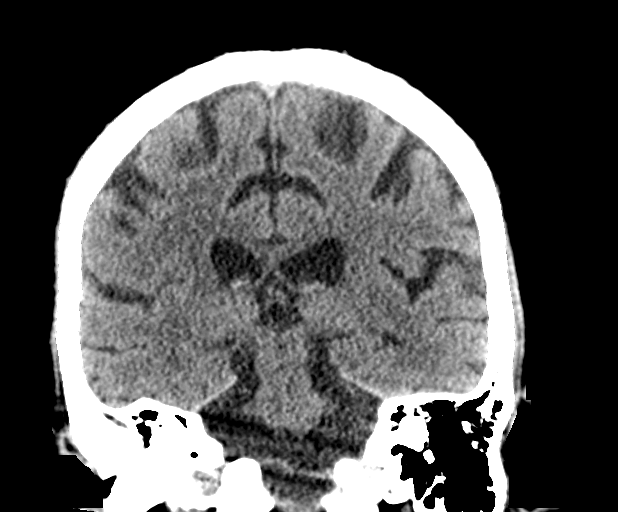

[Series 6: sagittal soft tissue · sagittal · 0.32mm/px · 3 of 64 slices shown]
[im 24/64  brain]
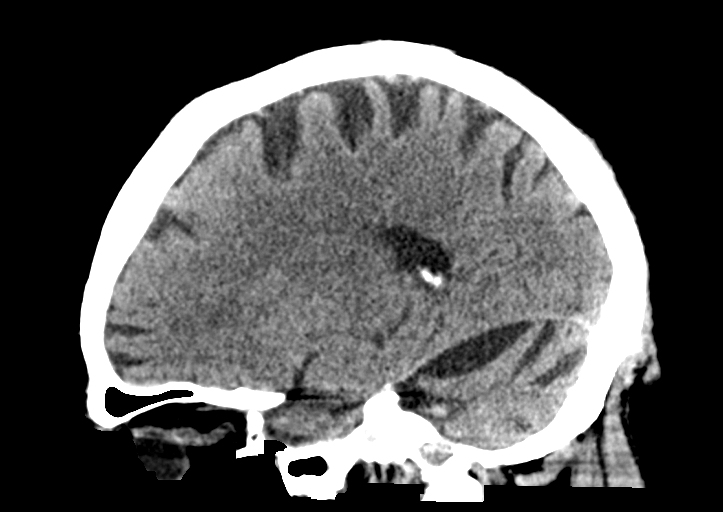
[im 32/64  brain]
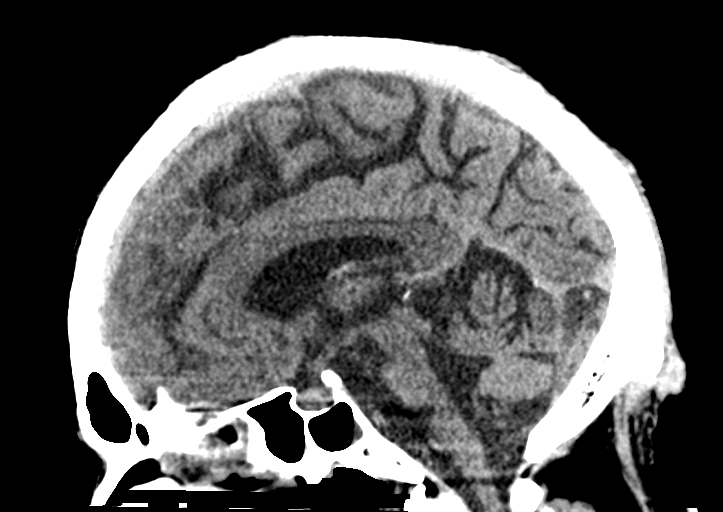
[im 40/64  brain]
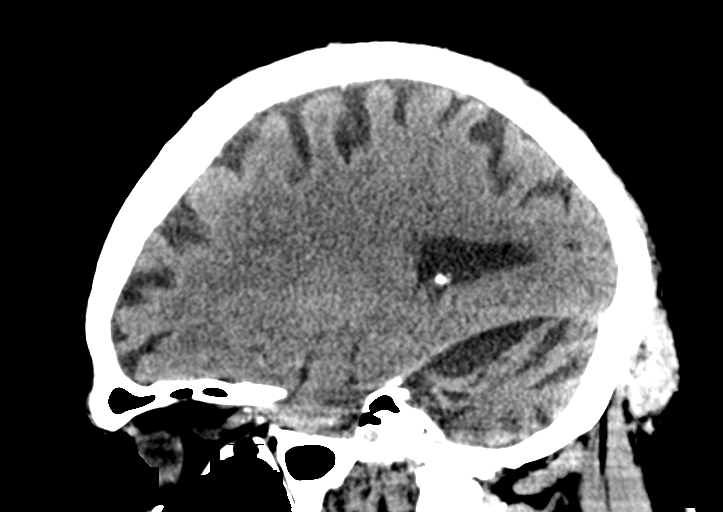

[15 of 47 positions shown; findings below may reference images not displayed]

FINDINGS: Brain: Diffuse cerebral atrophy. Mild ventricular dilatation
consistent with central atrophy. Patchy low-attenuation changes in
the deep white matter consistent small vessel ischemia. No
mass-effect or midline shift. No abnormal extra-axial fluid
collections. Gray-white matter junctions are distinct. Basal
cisterns are not effaced. No acute intracranial hemorrhage.

Vascular: Moderate intracranial arterial vascular calcifications.

Skull: Calvarium appears intact. No acute depressed skull fractures.

Sinuses/Orbits: Mucosal thickening in the paranasal sinuses with
opacification of ethmoid air cells, likely inflammatory. No acute
air-fluid levels. Diffuse opacification of right mastoid air cells
is similar to prior study. Left mastoid air cells are clear.

Other: None.
IMPRESSION: No acute intracranial abnormalities. Chronic atrophy and small
vessel ischemic changes. Chronic right mastoid effusions.

## 2021-04-22 ENCOUNTER — Telehealth: Payer: Self-pay

## 2021-04-22 NOTE — Telephone Encounter (Signed)
Call to schedule palliative follow up visit, no answer °
